# Patient Record
Sex: Female | Born: 1935 | Race: White | Hispanic: No | Smoking: Never smoker
Health system: Southern US, Community
[De-identification: ages and names within clinical notes are randomized; demographics above are authoritative.]

## PROBLEM LIST (undated history)

## (undated) DIAGNOSIS — H353290 Exudative age-related macular degeneration, unspecified eye, stage unspecified: Secondary | ICD-10-CM

## (undated) DIAGNOSIS — S62102A Fracture of unspecified carpal bone, left wrist, initial encounter for closed fracture: Secondary | ICD-10-CM

## (undated) DIAGNOSIS — G510 Bell's palsy: Secondary | ICD-10-CM

## (undated) DIAGNOSIS — E039 Hypothyroidism, unspecified: Secondary | ICD-10-CM

## (undated) DIAGNOSIS — H9190 Unspecified hearing loss, unspecified ear: Secondary | ICD-10-CM

## (undated) DIAGNOSIS — M1990 Unspecified osteoarthritis, unspecified site: Secondary | ICD-10-CM

## (undated) DIAGNOSIS — C8590 Non-Hodgkin lymphoma, unspecified, unspecified site: Secondary | ICD-10-CM

## (undated) DIAGNOSIS — F419 Anxiety disorder, unspecified: Secondary | ICD-10-CM

## (undated) DIAGNOSIS — J45909 Unspecified asthma, uncomplicated: Secondary | ICD-10-CM

## (undated) HISTORY — DX: Anxiety disorder, unspecified: F41.9

## (undated) HISTORY — DX: Exudative age-related macular degeneration, unspecified eye, stage unspecified: H35.3290

## (undated) HISTORY — DX: Non-Hodgkin lymphoma, unspecified, unspecified site: C85.90

## (undated) HISTORY — DX: Unspecified hearing loss, unspecified ear: H91.90

## (undated) HISTORY — DX: Hypothyroidism, unspecified: E03.9

## (undated) HISTORY — PX: CATARACT EXTRACTION: SUR2

## (undated) HISTORY — PX: TONSILLECTOMY: SUR1361

## (undated) HISTORY — PX: TUBAL LIGATION: SHX77

## (undated) HISTORY — DX: Unspecified osteoarthritis, unspecified site: M19.90

## (undated) HISTORY — DX: Bell's palsy: G51.0

## (undated) HISTORY — PX: APPENDECTOMY: SHX54

## (undated) HISTORY — DX: Unspecified asthma, uncomplicated: J45.909

## (undated) HISTORY — PX: CHOLECYSTECTOMY: SHX55

## (undated) HISTORY — DX: Fracture of unspecified carpal bone, left wrist, initial encounter for closed fracture: S62.102A

---

## 2017-09-18 DIAGNOSIS — J449 Chronic obstructive pulmonary disease, unspecified: Secondary | ICD-10-CM | POA: Insufficient documentation

## 2017-09-18 DIAGNOSIS — R000 Tachycardia, unspecified: Secondary | ICD-10-CM | POA: Insufficient documentation

## 2020-03-12 DIAGNOSIS — F329 Major depressive disorder, single episode, unspecified: Secondary | ICD-10-CM | POA: Insufficient documentation

## 2020-03-12 DIAGNOSIS — Z8572 Personal history of non-Hodgkin lymphomas: Secondary | ICD-10-CM | POA: Insufficient documentation

## 2020-03-12 DIAGNOSIS — M810 Age-related osteoporosis without current pathological fracture: Secondary | ICD-10-CM | POA: Insufficient documentation

## 2020-03-12 DIAGNOSIS — E669 Obesity, unspecified: Secondary | ICD-10-CM | POA: Insufficient documentation

## 2020-03-12 DIAGNOSIS — E1159 Type 2 diabetes mellitus with other circulatory complications: Secondary | ICD-10-CM | POA: Insufficient documentation

## 2020-03-12 DIAGNOSIS — J309 Allergic rhinitis, unspecified: Secondary | ICD-10-CM | POA: Insufficient documentation

## 2020-08-01 ENCOUNTER — Ambulatory Visit (INDEPENDENT_AMBULATORY_CARE_PROVIDER_SITE_OTHER): Payer: Medicare Other | Admitting: Nurse Practitioner

## 2020-08-01 ENCOUNTER — Encounter: Payer: Self-pay | Admitting: Nurse Practitioner

## 2020-08-01 ENCOUNTER — Other Ambulatory Visit: Payer: Self-pay

## 2020-08-01 VITALS — BP 122/78 | HR 85 | Temp 96.0°F | Ht 61.5 in | Wt 155.6 lb

## 2020-08-01 DIAGNOSIS — E039 Hypothyroidism, unspecified: Secondary | ICD-10-CM

## 2020-08-01 DIAGNOSIS — M170 Bilateral primary osteoarthritis of knee: Secondary | ICD-10-CM

## 2020-08-01 DIAGNOSIS — Z23 Encounter for immunization: Secondary | ICD-10-CM | POA: Diagnosis not present

## 2020-08-01 DIAGNOSIS — I10 Essential (primary) hypertension: Secondary | ICD-10-CM

## 2020-08-01 DIAGNOSIS — H353290 Exudative age-related macular degeneration, unspecified eye, stage unspecified: Secondary | ICD-10-CM

## 2020-08-01 DIAGNOSIS — E785 Hyperlipidemia, unspecified: Secondary | ICD-10-CM

## 2020-08-01 DIAGNOSIS — F419 Anxiety disorder, unspecified: Secondary | ICD-10-CM

## 2020-08-01 DIAGNOSIS — J4520 Mild intermittent asthma, uncomplicated: Secondary | ICD-10-CM

## 2020-08-01 NOTE — Progress Notes (Signed)
Careteam: No care team member to display  PLACE OF SERVICE:  [place]  Advanced Directive information    Allergies  Allergen Reactions   Iodine     Iodine Contrast    Chief Complaint  Patient presents with   Establish Care    New patient establish care. Flu vaccine today. Review medications and determine if any can be reduced. Patient does not use any assisted devices to ambulate. Patient lives with her daughter Butch Penny. Here with daughter, Butch Penny.      HPI: Patient is a 85 y.o. female to establish care.   Hx of non-hodgkins lymphoma- had a mild reaction to iodine in the past after one of her scan. In remission for so long she had not been to follow up or directed to do so.   Hx of bells palsy- decades ago  Osteoarthritis- severe in knee and hand (thumb) takes tylenol 650 mg twice daily, uses muscle rub as needed   Asthma- has albuterol uses daily and stiolto respimat daily   Hyperlipidemia- on zetia. Daughter does not feel like cholesterol has been an issue in the past. Needs fasting labs  Hypertension- controlled on verapamil 120 mg daily, had palpitations but no longer issue at this time.   Iron def anemia- on iron 3 times a week, gives her constipation so she does not take daily  Anxiety- on venlafaxine 75 mg twice daily. Daughter reports there was a lot of stress on her and things built up.  Doing well on current medication.   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 Recent diagnosis and getting injections. Got very worked up over the injection. Saw psychiatrist who prescribed hydroxyzine that she can take the day before and the day of which has helped. On preservision   Hypothyroid, acquired- on synthroid 75 mcg  Hx of bells palsy  Her daughter died in Mar 20, 2023 of breast cancer- still grieving the loss.   On MVI for supplement.    Review of Systems:  Review of Systems  Constitutional: Positive for malaise/fatigue.  Negative for chills, fever and weight loss.  HENT: Positive for hearing loss and tinnitus.   Respiratory: Positive for shortness of breath (due to asthma). Negative for cough and sputum production.   Cardiovascular: Negative for chest pain, palpitations and leg swelling.  Gastrointestinal: Positive for constipation and diarrhea. Negative for abdominal pain and heartburn.  Genitourinary: Negative for dysuria, frequency and urgency.  Musculoskeletal: Positive for joint pain and myalgias. Negative for back pain and falls.  Skin: Negative.   Neurological: Positive for dizziness. Negative for headaches.  Endo/Heme/Allergies: Bruises/bleeds easily (on ASA).  Psychiatric/Behavioral: Negative for depression and memory loss. The patient is nervous/anxious (controlled with medication). The patient does not have insomnia.     Past Medical History:  Diagnosis Date   Anxiety    Asthma    Bell's palsy    Hypothyroidism    Left wrist fracture    Macular degeneration, wet (HCC) nxiety   Non Hodgkin's lymphoma ([place])    Osteoarthritis    Past Surgical History:  Procedure Laterality Date   APPENDECTOMY     CATARACT EXTRACTION     CHOLECYSTECTOMY     TONSILLECTOMY     TUBAL LIGATION     Social History:   reports that she has never smoked. She has never used smokeless tobacco. She reports that she does not drink alcohol and does not use drugs.  Family History  Problem Relation Age  of Onset   Throat cancer Mother    Heart attack Father    Lung cancer Sister    Dementia Sister    COPD Sister    Obesity Daughter    Pulmonary embolism Daughter    Breast cancer Daughter    Endometriosis Daughter    Hashimoto's thyroiditis Daughter     Medications: Patient's Medications  New Prescriptions   No medications on file  Previous Medications   ACETAMINOPHEN (TYLENOL) 650 MG CR TABLET    Take 650 mg by mouth 2 (two) times daily.   ALBUTEROL (VENTOLIN HFA) 108 (90 BASE)  MCG/ACT INHALER    Inhale 2 puffs into the lungs. Emergency use   ASPIRIN EC 81 MG TABLET    Take 81 mg by mouth daily. Swallow whole.   EZETIMIBE (ZETIA) 10 MG TABLET    Take 10 mg by mouth daily.   FERROUS SULFATE 324 (65 FE) MG TBEC    Take by mouth 3 (three) times a week.   HYDROXYZINE (ATARAX/VISTARIL) 10 MG TABLET    Take 10 mg by mouth daily as needed.   LEVOTHYROXINE (SYNTHROID) 75 MCG TABLET    Take 75 mcg by mouth daily before breakfast.   MULTIPLE VITAMINS-MINERALS (CENTRUM SILVER PO)    Take 1 tablet by mouth daily.   MULTIPLE VITAMINS-MINERALS (PRESERVISION AREDS PO)    Take by mouth in the morning and at bedtime.   TIOTROPIUM BROMIDE-OLODATEROL (STIOLTO RESPIMAT) 2.5-2.5 MCG/ACT AERS    Inhale 2 puffs into the lungs daily.   VENLAFAXINE (EFFEXOR) 75 MG TABLET    Take 75 mg by mouth 2 (two) times daily.   VERAPAMIL (VERELAN PM) 120 MG 24 HR CAPSULE    Take 120 mg by mouth daily.  Modified Medications   No medications on file  Discontinued Medications   No medications on file    Physical Exam:  Vitals:   08/01/20 1339  BP: 122/78  Pulse: 85  Temp: (!) 96 F (35.6 C)  TempSrc: Temporal  SpO2: 96%  Weight: 155 lb 9.6 oz (70.6 kg)  Height: 5' 1.5" (1.562 m)   Body mass index is 28.92 kg/m. Wt Readings from Last 3 Encounters:  08/01/20 155 lb 9.6 oz (70.6 kg)    Physical Exam Constitutional:      General: She is not in acute distress.    Appearance: She is well-developed. She is not diaphoretic.  HENT:     Head: Normocephalic and atraumatic.     Mouth/Throat:     Pharynx: No oropharyngeal exudate.  Eyes:     Conjunctiva/sclera: Conjunctivae normal.     Pupils: Pupils are equal, round, and reactive to light.  Cardiovascular:     Rate and Rhythm: Normal rate and regular rhythm.     Heart sounds: Normal heart sounds.  Pulmonary:     Effort: Pulmonary effort is normal.     Breath sounds: Normal breath sounds.  Abdominal:     General: Bowel sounds are normal.      Palpations: Abdomen is soft.  Musculoskeletal:        General: No tenderness.     Cervical back: Normal range of motion and neck supple.  Skin:    General: Skin is warm and dry.  Neurological:     Mental Status: She is alert and oriented to person, place, and time.  Psychiatric:        Mood and Affect: Mood normal.        Behavior: Behavior normal.  Labs reviewed: Basic Metabolic Panel: No results for input(s): NA, K, CL, CO2, GLUCOSE, BUN, CREATININE, CALCIUM, MG, PHOS, TSH in the last 8760 hours. Liver Function Tests: No results for input(s): AST, ALT, ALKPHOS, BILITOT, PROT, ALBUMIN in the last 8760 hours. No results for input(s): LIPASE, AMYLASE in the last 8760 hours. No results for input(s): AMMONIA in the last 8760 hours. CBC: No results for input(s): WBC, NEUTROABS, HGB, HCT, MCV, PLT in the last 8760 hours. Lipid Panel: No results for input(s): CHOL, HDL, LDLCALC, TRIG, CHOLHDL, LDLDIRECT in the last 8760 hours. TSH: No results for input(s): TSH in the last 8760 hours. A1C: No results found for: HGBA1C   Assessment/Plan 1. Bilateral primary osteoarthritis of knee Ongoing, reports severe, taking tylenol 650 mg CR BID.  -encouraged exercise and strength training.  - AMB referral to orthopedics for further evaluation and possible injections -encouraged to avoid NSAIDs due to age and risk of GI and kidney complications.  2. Acquired hypothyroidism -stable on synthroid 75 mcg. - TSH; Future  3. Primary hypertension Controlled on verapamil with dietary modifications.  - COMPLETE METABOLIC PANEL WITH GFR; Future - CBC with Differential/Platelet; Future  4. Hyperlipidemia  -continues on zetia, questions if she needs this medication Will follow up fasting labs Continue dietary modifications.  - Lipid Panel; Future - COMPLETE METABOLIC PANEL WITH GFR; Future  5. Need for influenza vaccination - Flu Vaccine QUAD High Dose(Fluad)  6. Exudative age-related  macular degeneration, unspecified laterality, unspecified stage ([place]) Followed by retina specialist with injections, taking hydroxyzine PRN prior to injections due to increase in anxiety.  7. Mild intermittent asthma, unspecified whether complicated Controlled on stiolto daily with albuterol PRN  8. Anxiety Lots of life stressors and she started Effexor she has been doing well with this. To continue current regimen as it is well controlled at this time.   Next appt: 4 weeks for AWV 2 months for follow up [place]. [place], [facility] [phone]

## 2020-08-01 NOTE — Patient Instructions (Signed)
To schedule fasting labs in the next few weeks  Follow up for awv (telephone visit) in 1 month  In office follow up in 2 month for routine follow up

## 2020-08-08 ENCOUNTER — Other Ambulatory Visit: Payer: Self-pay

## 2020-08-08 ENCOUNTER — Other Ambulatory Visit: Payer: Medicare Other

## 2020-08-08 DIAGNOSIS — E785 Hyperlipidemia, unspecified: Secondary | ICD-10-CM

## 2020-08-08 DIAGNOSIS — I10 Essential (primary) hypertension: Secondary | ICD-10-CM

## 2020-08-08 DIAGNOSIS — E039 Hypothyroidism, unspecified: Secondary | ICD-10-CM

## 2020-08-11 LAB — COMPLETE METABOLIC PANEL WITH GFR
AG Ratio: 1.7 (calc) (ref 1.0–2.5)
ALT: 14 U/L (ref 6–29)
AST: 17 U/L (ref 10–35)
Albumin: 4 g/dL (ref 3.6–5.1)
Alkaline phosphatase (APISO): 71 U/L (ref 37–153)
BUN: 13 mg/dL (ref 7–25)
CO2: 30 mmol/L (ref 20–32)
Calcium: 9.3 mg/dL (ref 8.6–10.4)
Chloride: 104 mmol/L (ref 98–110)
Creat: 0.78 mg/dL (ref 0.60–0.88)
GFR, Est African American: 81 mL/min/{1.73_m2} (ref >=60)
GFR, Est Non African American: 70 mL/min/{1.73_m2} (ref >=60)
Globulin: 2.3 g/dL (calc) (ref 1.9–3.7)
Glucose, Bld: 107 mg/dL — ABNORMAL HIGH (ref 65–99)
Potassium: 4.1 mmol/L (ref 3.5–5.3)
Sodium: 141 mmol/L (ref 135–146)
Total Bilirubin: 0.5 mg/dL (ref 0.2–1.2)
Total Protein: 6.3 g/dL (ref 6.1–8.1)

## 2020-08-11 LAB — CBC WITH DIFFERENTIAL/PLATELET
Absolute Monocytes: 372 cells/uL (ref 200–950)
Basophils Absolute: 30 cells/uL (ref 0–200)
Basophils Relative: 0.5 %
Eosinophils Absolute: 130 cells/uL (ref 15–500)
Eosinophils Relative: 2.2 %
HCT: 42.7 % (ref 35.0–45.0)
Hemoglobin: 13.8 g/dL (ref 11.7–15.5)
Lymphs Abs: 2690 cells/uL (ref 850–3900)
MCH: 29.7 pg (ref 27.0–33.0)
MCHC: 32.3 g/dL (ref 32.0–36.0)
MCV: 92 fL (ref 80.0–100.0)
MPV: 9.2 fL (ref 7.5–12.5)
Monocytes Relative: 6.3 %
Neutro Abs: 2679 cells/uL (ref 1500–7800)
Neutrophils Relative %: 45.4 %
Platelets: 290 10*3/uL (ref 140–400)
RBC: 4.64 10*6/uL (ref 3.80–5.10)
RDW: 14.5 % (ref 11.0–15.0)
Total Lymphocyte: 45.6 %
WBC: 5.9 10*3/uL (ref 3.8–10.8)

## 2020-08-11 LAB — TEST AUTHORIZATION

## 2020-08-11 LAB — LIPID PANEL
Cholesterol: 182 mg/dL (ref <200)
HDL: 52 mg/dL (ref >=50)
LDL Cholesterol (Calc): 107 mg/dL (calc) — ABNORMAL HIGH
Non-HDL Cholesterol (Calc): 130 mg/dL (calc) — ABNORMAL HIGH (ref <130)
Total CHOL/HDL Ratio: 3.5 (calc) (ref <5.0)
Triglycerides: 115 mg/dL (ref <150)

## 2020-08-11 LAB — HEMOGLOBIN A1C
Hgb A1c MFr Bld: 5.6 % of total Hgb (ref <5.7)
Mean Plasma Glucose: 114 (calc)
eAG (mmol/L): 6.3 (calc)

## 2020-08-11 LAB — TSH: TSH: 5.81 mIU/L — ABNORMAL HIGH (ref 0.40–4.50)

## 2020-08-13 ENCOUNTER — Other Ambulatory Visit: Payer: Self-pay | Admitting: *Deleted

## 2020-08-13 MED ORDER — ALBUTEROL SULFATE HFA 108 (90 BASE) MCG/ACT IN AERS: 2.0000 | INHALATION_SPRAY | Freq: Every day | RESPIRATORY_TRACT | 3 refills | Status: DC | PRN

## 2020-08-13 NOTE — Telephone Encounter (Signed)
Patient daughter, Butch Penny, called requesting a refill. Stated that they left her inhaler in patient's hometown by accident when they moved and needs a refill.

## 2020-08-14 ENCOUNTER — Ambulatory Visit: Payer: Medicare Other | Admitting: Orthopedic Surgery

## 2020-08-21 ENCOUNTER — Ambulatory Visit (INDEPENDENT_AMBULATORY_CARE_PROVIDER_SITE_OTHER): Payer: Medicare Other

## 2020-08-21 ENCOUNTER — Ambulatory Visit (INDEPENDENT_AMBULATORY_CARE_PROVIDER_SITE_OTHER): Payer: Medicare Other | Admitting: Orthopaedic Surgery

## 2020-08-21 ENCOUNTER — Ambulatory Visit: Payer: Self-pay

## 2020-08-21 VITALS — Ht 61.5 in | Wt 155.6 lb

## 2020-08-21 DIAGNOSIS — M1711 Unilateral primary osteoarthritis, right knee: Secondary | ICD-10-CM | POA: Insufficient documentation

## 2020-08-21 DIAGNOSIS — G8929 Other chronic pain: Secondary | ICD-10-CM

## 2020-08-21 DIAGNOSIS — M25562 Pain in left knee: Secondary | ICD-10-CM

## 2020-08-21 DIAGNOSIS — M25561 Pain in right knee: Secondary | ICD-10-CM

## 2020-08-21 DIAGNOSIS — M1712 Unilateral primary osteoarthritis, left knee: Secondary | ICD-10-CM | POA: Insufficient documentation

## 2020-08-21 MED ORDER — MELOXICAM 15 MG PO TABS: 15.0000 mg | ORAL_TABLET | Freq: Every day | ORAL | 1 refills | Status: DC

## 2020-08-21 NOTE — Progress Notes (Signed)
Office Visit Note   Patient: Barbara Lopez           Date of Birth: March 25, 1935           MRN: [id number] Visit Date: 08/21/2020              Requested by: Lauree Chandler, NP [place],  [place] [id number] PCP: Lauree Chandler, NP   Assessment & Plan: Visit Diagnoses:  1. Chronic pain of left knee   2. Chronic pain of right knee   3. Unilateral primary osteoarthritis, left knee   4. Unilateral primary osteoarthritis, right knee     Plan: I agree with trying to stay as conservative as possible with her knees.  She has not interested in injections yet.  I agree with her taking Tylenol arthritis but we will add meloxicam daily.  I would like to see her back in 4 weeks to see how the meloxicam is working.  Celebrex would be another option as an anti-inflammatory.  We can always inject her knees if it comes to it.  All questions and concerns were answered and addressed.  Follow-Up Instructions: Return in about 4 weeks (around 09/18/2020).   Orders:  Orders Placed This Encounter  Procedures  . XR Knee 1-2 Views Left  . XR Knee 1-2 Views Right   Meds ordered this encounter  Medications  . meloxicam (MOBIC) 15 MG tablet    Sig: Take 1 tablet (15 mg total) by mouth daily.    Dispense:  30 tablet    Refill:  1      Procedures: No procedures performed   Clinical Data: No additional findings.   Subjective: Chief Complaint  Patient presents with  . Left Knee - Pain  . Right Knee - Pain  The patient comes in today as a new patient with her daughter.  She wants to establish care in our area now having moved from [place].  She has bilateral knee pains been going on for many years now.  Both knees hurt but the right is worse than left.  She is never had surgery on her knees.  She is never had injections.  She does not take any anti-inflammatories on a regular basis.  Her daughter who is with her who works in radiology states that there are some family members  that want her to consider knee replacement surgery.  She has not had therapy either but will probably have a hard time participating in therapy.  She has just some mild memory issues.  She is not a diabetic and is not on blood thinning medication.  She does take an occasional Tylenol for pain in her knees.  HPI  Review of Systems She currently denies any headache, chest pain, shortness of breath, fever, chills, nausea, vomiting  Objective: Vital Signs: Ht 5' 1.5" (1.562 m)   Wt 155 lb 9.6 oz (70.6 kg)   BMI 28.92 kg/m   Physical Exam She is alert and orient x3 and in no acute distress Ortho Exam Examination of both knees shows varus malalignment.  The right knee has no significant effusion but significant bony swelling and severe crepitation throughout range of motion of that knee.  The left knee also has some crepitation but not nearly as much of the right knee.  Both knees are ligamentously stable but painful throughout the arc of motion. Specialty Comments:  No specialty comments available.  Imaging: XR Knee 1-2 Views Left  Result Date: 08/21/2020  2 views of the left knee show tricompartmental arthritic changes with para-articular osteophytes in all 3 compartments and medial joint space narrowing with slight varus malalignment.  XR Knee 1-2 Views Right  Result Date: 08/21/2020 2 views of the right knee show severe end-stage arthritis with complete loss of the medial joint space.  There are large periarticular osteophytes in all 3 compartments and varus malalignment.    PMFS History: Patient Active Problem List   Diagnosis Date Noted  . Unilateral primary osteoarthritis, left knee 08/21/2020  . Unilateral primary osteoarthritis, right knee 08/21/2020   Past Medical History:  Diagnosis Date  . Anxiety   . Asthma   . Bell's palsy   . Hypothyroidism   . Left wrist fracture   . Macular degeneration, wet ([place]) nxiety  . Non Hodgkin's lymphoma ([place])   . Osteoarthritis      Family History  Problem Relation Age of Onset  . Throat cancer Mother   . Heart attack Father   . Lung cancer Sister   . Dementia Sister   . COPD Sister   . Obesity Daughter   . Pulmonary embolism Daughter   . Breast cancer Daughter   . Endometriosis Daughter   . Hashimoto's thyroiditis Daughter     Past Surgical History:  Procedure Laterality Date  . APPENDECTOMY    . CATARACT EXTRACTION    . CHOLECYSTECTOMY    . TONSILLECTOMY    . TUBAL LIGATION     Social History   Occupational History  . Not on file  Tobacco Use  . Smoking status: Never Smoker  . Smokeless tobacco: Never Used  Vaping Use  . Vaping Use: Never used  Substance and Sexual Activity  . Alcohol use: Never  . Drug use: Never  . Sexual activity: Not Currently

## 2020-09-12 ENCOUNTER — Ambulatory Visit: Payer: Medicare Other | Admitting: Nurse Practitioner

## 2020-09-14 ENCOUNTER — Other Ambulatory Visit: Payer: Self-pay

## 2020-09-14 ENCOUNTER — Telehealth: Payer: Self-pay

## 2020-09-14 ENCOUNTER — Ambulatory Visit (INDEPENDENT_AMBULATORY_CARE_PROVIDER_SITE_OTHER): Payer: Medicare Other | Admitting: Nurse Practitioner

## 2020-09-14 ENCOUNTER — Encounter: Payer: Self-pay | Admitting: Nurse Practitioner

## 2020-09-14 DIAGNOSIS — E2839 Other primary ovarian failure: Secondary | ICD-10-CM

## 2020-09-14 DIAGNOSIS — H9190 Unspecified hearing loss, unspecified ear: Secondary | ICD-10-CM

## 2020-09-14 DIAGNOSIS — Z0000 Encounter for general adult medical examination without abnormal findings: Secondary | ICD-10-CM

## 2020-09-14 NOTE — Patient Instructions (Signed)
Barbara Lopez , Thank you for taking time to come for your Medicare Wellness Visit. I appreciate your ongoing commitment to your health goals. Please review the following plan we discussed and let me know if I can assist you in the future.   Screening recommendations/referrals: Colonoscopy aged out Mammogram aged out Bone Density ordered today, call [phone] Recommended yearly ophthalmology/optometry visit for glaucoma screening and checkup Recommended yearly dental visit for hygiene and checkup  Vaccinations: Influenza vaccine up to date Pneumococcal vaccine up to date Tdap vaccine up to date  Shingles vaccine- RECOMMENDED to get [facility] at local pharmacy  Advanced directives: to completed and place on file. Look over MOST form (to complete together in office) healthcare POA and living will can be completed prior to office visit and notarized- bring to office to place on file.   Conditions/risks identified: advanced age, Memory loss   Next appointment: 1 year for AWV   Preventive Care 23 Years and Older, Female Preventive care refers to lifestyle choices and visits with your health care provider that can promote health and wellness. What does preventive care include?  A yearly physical exam. This is also called an annual well check.  Dental exams once or twice a year.  Routine eye exams. Ask your health care provider how often you should have your eyes checked.  Personal lifestyle choices, including:  Daily care of your teeth and gums.  Regular physical activity.  Eating a healthy diet.  Avoiding tobacco and drug use.  Limiting alcohol use.  Practicing safe sex.  Taking low-dose aspirin every day.  Taking vitamin and mineral supplements as recommended by your health care provider. What happens during an annual well check? The services and screenings done by your health care provider during your annual well check will depend on your age, overall health, lifestyle  risk factors, and family history of disease. Counseling  Your health care provider may ask you questions about your:  Alcohol use.  Tobacco use.  Drug use.  Emotional well-being.  Home and relationship well-being.  Sexual activity.  Eating habits.  History of falls.  Memory and ability to understand (cognition).  Work and work Statistician.  Reproductive health. Screening  You may have the following tests or measurements:  Height, weight, and BMI.  Blood pressure.  Lipid and cholesterol levels. These may be checked every 5 years, or more frequently if you are over 51 years old.  Skin check.  Lung cancer screening. You may have this screening every year starting at age 64 if you have a 30-pack-year history of smoking and currently smoke or have quit within the past 15 years.  Fecal occult blood test (FOBT) of the stool. You may have this test every year starting at age 54.  Flexible sigmoidoscopy or colonoscopy. You may have a sigmoidoscopy every 5 years or a colonoscopy every 10 years starting at age 60.  Hepatitis C blood test.  Hepatitis B blood test.  Sexually transmitted disease (STD) testing.  Diabetes screening. This is done by checking your blood sugar (glucose) after you have not eaten for a while (fasting). You may have this done every 1-3 years.  Bone density scan. This is done to screen for osteoporosis. You may have this done starting at age 76.  Mammogram. This may be done every 1-2 years. Talk to your health care provider about how often you should have regular mammograms. Talk with your health care provider about your test results, treatment options, and if necessary, the  need for more tests. Vaccines  Your health care provider may recommend certain vaccines, such as:  Influenza vaccine. This is recommended every year.  Tetanus, diphtheria, and acellular pertussis (Tdap, Td) vaccine. You may need a Td booster every 10 years.  Zoster vaccine.  You may need this after age 9.  Pneumococcal 13-valent conjugate (PCV13) vaccine. One dose is recommended after age 21.  Pneumococcal polysaccharide (PPSV23) vaccine. One dose is recommended after age 67. Talk to your health care provider about which screenings and vaccines you need and how often you need them. This information is not intended to replace advice given to you by your health care provider. Make sure you discuss any questions you have with your health care provider. Document Released: 09/21/2015 Document Revised: 05/14/2016 Document Reviewed: 06/26/2015 Elsevier Interactive Patient Education  2017 [place] Prevention in the Home Falls can cause injuries. They can happen to people of all ages. There are many things you can do to make your home safe and to help prevent falls. What can I do on the outside of my home?  Regularly fix the edges of walkways and driveways and fix any cracks.  Remove anything that might make you trip as you walk through a door, such as a raised step or threshold.  Trim any bushes or trees on the path to your home.  Use bright outdoor lighting.  Clear any walking paths of anything that might make someone trip, such as rocks or tools.  Regularly check to see if handrails are loose or broken. Make sure that both sides of any steps have handrails.  Any raised decks and porches should have guardrails on the edges.  Have any leaves, snow, or ice cleared regularly.  Use sand or salt on walking paths during winter.  Clean up any spills in your garage right away. This includes oil or grease spills. What can I do in the bathroom?  Use night lights.  Install grab bars by the toilet and in the tub and shower. Do not use towel bars as grab bars.  Use non-skid mats or decals in the tub or shower.  If you need to sit down in the shower, use a plastic, non-slip stool.  Keep the floor dry. Clean up any water that spills on the floor as soon  as it happens.  Remove soap buildup in the tub or shower regularly.  Attach bath mats securely with double-sided non-slip rug tape.  Do not have throw rugs and other things on the floor that can make you trip. What can I do in the bedroom?  Use night lights.  Make sure that you have a light by your bed that is easy to reach.  Do not use any sheets or blankets that are too big for your bed. They should not hang down onto the floor.  Have a firm chair that has side arms. You can use this for support while you get dressed.  Do not have throw rugs and other things on the floor that can make you trip. What can I do in the kitchen?  Clean up any spills right away.  Avoid walking on wet floors.  Keep items that you use a lot in easy-to-reach places.  If you need to reach something above you, use a strong step stool that has a grab bar.  Keep electrical cords out of the way.  Do not use floor polish or wax that makes floors slippery. If you must use wax,  use non-skid floor wax.  Do not have throw rugs and other things on the floor that can make you trip. What can I do with my stairs?  Do not leave any items on the stairs.  Make sure that there are handrails on both sides of the stairs and use them. Fix handrails that are broken or loose. Make sure that handrails are as long as the stairways.  Check any carpeting to make sure that it is firmly attached to the stairs. Fix any carpet that is loose or worn.  Avoid having throw rugs at the top or bottom of the stairs. If you do have throw rugs, attach them to the floor with carpet tape.  Make sure that you have a light switch at the top of the stairs and the bottom of the stairs. If you do not have them, ask someone to add them for you. What else can I do to help prevent falls?  Wear shoes that:  Do not have high heels.  Have rubber bottoms.  Are comfortable and fit you well.  Are closed at the toe. Do not wear sandals.  If  you use a stepladder:  Make sure that it is fully opened. Do not climb a closed stepladder.  Make sure that both sides of the stepladder are locked into place.  Ask someone to hold it for you, if possible.  Clearly mark and make sure that you can see:  Any grab bars or handrails.  First and last steps.  Where the edge of each step is.  Use tools that help you move around (mobility aids) if they are needed. These include:  Canes.  Walkers.  Scooters.  Crutches.  Turn on the lights when you go into a dark area. Replace any light bulbs as soon as they burn out.  Set up your furniture so you have a clear path. Avoid moving your furniture around.  If any of your floors are uneven, fix them.  If there are any pets around you, be aware of where they are.  Review your medicines with your doctor. Some medicines can make you feel dizzy. This can increase your chance of falling. Ask your doctor what other things that you can do to help prevent falls. This information is not intended to replace advice given to you by your health care provider. Make sure you discuss any questions you have with your health care provider. Document Released: 06/21/2009 Document Revised: 01/31/2016 Document Reviewed: 09/29/2014 Elsevier Interactive Patient Education  2017 [employer].

## 2020-09-14 NOTE — Telephone Encounter (Signed)
Ms. iyanla, eilers are scheduled for a virtual visit with your provider today.    Just as we do with appointments in the office, we must obtain your consent to participate.  Your consent will be active for this visit and any virtual visit you may have with one of our providers in the next 365 days.    If you have a MyChart account, I can also send a copy of this consent to you electronically.  All virtual visits are billed to your insurance company just like a traditional visit in the office.  As this is a virtual visit, video technology does not allow for your provider to perform a traditional examination.  This may limit your provider's ability to fully assess your condition.  If your provider identifies any concerns that need to be evaluated in person or the need to arrange testing such as labs, EKG, etc, we will make arrangements to do so.    Although advances in technology are sophisticated, we cannot ensure that it will always work on either your end or our end.  If the connection with a video visit is poor, we may have to switch to a telephone visit.  With either a video or telephone visit, we are not always able to ensure that we have a secure connection.   I need to obtain your verbal consent now.   Are you willing to proceed with your visit today?   Shandie Farina and daugher, Butch Penny has provided verbal consent on 09/14/2020 for a virtual visit (video or telephone).   Leigh [place], [place] 09/14/2020  8:43 AM

## 2020-09-14 NOTE — Progress Notes (Signed)
   This service is provided via telemedicine  No vital signs collected/recorded due to the encounter was a telemedicine visit.   Location of patient (ex: home, work):  Home  Patient consents to a telephone visit: Yes, see telephone visit dated 09/14/20  Location of the provider (ex: office, home):  [facility], Office   Name of any referring provider:  N/A  Names of all persons participating in the telemedicine service and their role in the encounter:  S.Chrae B/CMA, Sherrie Mustache, NP, Butch Penny (daughter), and Patient   Time spent on call:  9 min with medical assistant

## 2020-09-14 NOTE — Progress Notes (Signed)
Subjective:   Barbara Lopez is a 86 y.o. female who presents for Medicare Annual (Subsequent) preventive examination.  Review of Systems     Cardiac Risk Factors include: advanced age (>51men, >51 women)     Objective:    There were no vitals filed for this visit. There is no height or weight on file to calculate BMI.  No flowsheet data found.  Current Medications (verified) Outpatient Encounter Medications as of 09/14/2020  Medication Sig  . acetaminophen (TYLENOL) 650 MG CR tablet Take 650 mg by mouth as needed.  Marland Kitchen albuterol (VENTOLIN HFA) 108 (90 Base) MCG/ACT inhaler Inhale 2 puffs into the lungs daily as needed for wheezing or shortness of breath. Emergency use  . ezetimibe (ZETIA) 10 MG tablet Take 10 mg by mouth daily.  . ferrous sulfate 324 (65 Fe) MG TBEC Take by mouth 3 (three) times a week.  . hydrOXYzine (ATARAX/VISTARIL) 10 MG tablet Take 10 mg by mouth daily as needed.  Marland Kitchen levothyroxine (SYNTHROID) 75 MCG tablet Take 75 mcg by mouth daily before breakfast.  . meloxicam (MOBIC) 15 MG tablet Take 1 tablet (15 mg total) by mouth daily.  . Multiple Vitamins-Minerals (CENTRUM SILVER PO) Take 1 tablet by mouth daily.  . Multiple Vitamins-Minerals (PRESERVISION AREDS PO) Take by mouth in the morning and at bedtime.  . Tiotropium Bromide-Olodaterol (STIOLTO RESPIMAT) 2.5-2.5 MCG/ACT AERS Inhale 2 puffs into the lungs daily.  Marland Kitchen venlafaxine (EFFEXOR) 75 MG tablet Take 75 mg by mouth 2 (two) times daily.  . verapamil (VERELAN PM) 120 MG 24 hr capsule Take 120 mg by mouth daily.  . [DISCONTINUED] aspirin EC 81 MG tablet Take 81 mg by mouth daily. Swallow whole.   No facility-administered encounter medications on file as of 09/14/2020.    Allergies (verified) Iodine   History: Past Medical History:  Diagnosis Date  . Anxiety   . Asthma   . Bell's palsy   . Hypothyroidism   . Left wrist fracture   . Macular degeneration, wet ([place]) nxiety  . Non Hodgkin's lymphoma ([place])    . Osteoarthritis    Past Surgical History:  Procedure Laterality Date  . APPENDECTOMY    . CATARACT EXTRACTION    . CHOLECYSTECTOMY    . TONSILLECTOMY    . TUBAL LIGATION     Family History  Problem Relation Age of Onset  . Throat cancer Mother   . Heart attack Father   . Lung cancer Sister   . Dementia Sister   . COPD Sister   . Obesity Daughter   . Pulmonary embolism Daughter   . Breast cancer Daughter   . Endometriosis Daughter   . Hashimoto's thyroiditis Daughter    Social History   Socioeconomic History  . Marital status: Widowed    Spouse name: Not on file  . Number of children: Not on file  . Years of education: Not on file  . Highest education level: Not on file  Occupational History  . Not on file  Tobacco Use  . Smoking status: Never Smoker  . Smokeless tobacco: Never Used  Vaping Use  . Vaping Use: Never used  Substance and Sexual Activity  . Alcohol use: Never  . Drug use: Never  . Sexual activity: Not Currently  Other Topics Concern  . Not on file  Social History Narrative   Diet: Regular      Do you drink/ eat things with caffeine? Coffee      Marital status:   Widowed  What year were you married ? 1957      Do you live in a house, apartment,assistred living, condo, trailer, etc.)?  House      Is it one or more stories? 2 Stories      How many persons live in your home ? 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  House Work and Walk to [employer]                            Type & how often  Daily      ADVANCED DIRECTIVES (Please bring copies)      Do you have a living will? No      Do you have a DNR form? No                      If not, do you want to discuss one? Yes      Do you have signed POA?HPOA forms?   No              If so, please bring to your appointment       FUNCTIONAL STATUS- To be completed by Spouse / child / Staff       Do you have difficulty bathing or dressing yourself ?  No      Do you have difficulty preparing food or eating ?  No      Do you have difficulty managing your mediation ?  No      Do you have difficulty managing your finances ?  No      Do you have difficulty affording your medication ?  No      Social Determinants of Radio broadcast assistant Strain: Not on file  Food Insecurity: Not on file  Transportation Needs: Not on file  Physical Activity: Not on file  Stress: Not on file  Social Connections: Not on file    Tobacco Counseling Counseling given: Not Answered   Clinical Intake:  Pre-visit preparation completed: Yes  Pain : No/denies pain     BMI - recorded: 28 Nutritional Status: BMI 25 -29 Overweight Diabetes: No  How often do you need to have someone help you when you read instructions, pamphlets, or other written materials from your doctor or pharmacy?: 4 - Often  Diabetic?no         Activities of Daily Living In your present state of health, do you have any difficulty performing the following activities: 09/14/2020  Hearing? Y  Vision? Y  Difficulty concentrating or making decisions? N  Walking or climbing stairs? N  Dressing or bathing? N  Doing errands, shopping? N  Preparing Food and eating ? N  Using the Toilet? N  In the past six months, have you accidently leaked urine? N  Do you have problems with loss of bowel control? N  Managing your Medications? N  Managing your Finances? Y  Comment daughter help  Housekeeping or managing your Housekeeping? N    Patient Care Team: Lauree Chandler, NP as PCP - General (Geriatric Medicine)  Indicate any recent Medical Services you may have received from other than [facility] providers in the past year (date  may be approximate).     Assessment:   This is a routine wellness examination for Barbara Lopez.  Hearing/Vision screen   Hearing Screening   125Hz  250Hz  500Hz  1000Hz  2000Hz  3000Hz  4000Hz  6000Hz  8000Hz   Right ear:           Left ear:           Comments: Decreased hearing, needs to be evaluated   Vision Screening Comments: Last eye exam less than 12 months ago  Dietary issues and exercise activities discussed: Current Exercise Habits: The patient does not participate in regular exercise at present  Goals    . Patient Stated     To maintain current level of health      Depression Screen PHQ 2/9 Scores 09/14/2020 08/01/2020  PHQ - 2 Score 0 0    Fall Risk Fall Risk  09/14/2020 08/01/2020  Falls in the past year? 0 1  Number falls in past yr: 0 0  Injury with Fall? 0 0    FALL RISK PREVENTION PERTAINING TO THE HOME:  Any stairs in or around the home? Yes  If so, are there any without handrails? No  Home free of loose throw rugs in walkways, pet beds, electrical cords, etc? Yes  Adequate lighting in your home to reduce risk of falls? Yes   ASSISTIVE DEVICES UTILIZED TO PREVENT FALLS:  Life alert? No  Use of a cane, walker or w/c? No  Grab bars in the bathroom? Yes  Shower chair or bench in shower? Yes  Elevated toilet seat or a handicapped toilet? Yes   TIMED UP AND GO:  Was the test performed? No .    Cognitive Function:     6CIT Screen 09/14/2020  What Year? 0 points  What month? 0 points  What time? 0 points  Count back from 20 0 points  Months in reverse 4 points  Repeat phrase 6 points  Total Score 10    Immunizations Immunization History  Administered Date(s) Administered  . Fluad Quad(high Dose 65+) 08/01/2020  . Influenza-Unspecified 06/17/2019  . Moderna SARS-COV2 Booster Vaccination 09/11/2020  . Moderna Sars-Covid-2 Vaccination 11/04/2019, 12/02/2019  . Pneumococcal Conjugate-13 07/18/2016  . Pneumococcal Polysaccharide-23 04/08/2002  . Tdap 12/26/2016  . Zoster 07/18/2016    TDAP status: Due, Education has been provided regarding the importance of this vaccine.  Advised may receive this vaccine at local pharmacy or Health Dept. Aware to provide a copy of the vaccination record if obtained from local pharmacy or Health Dept. Verbalized acceptance and understanding.  Flu Vaccine status: Up to date  Pneumococcal vaccine status: Up to date  Covid-19 vaccine status: Completed vaccines  Qualifies for Shingles Vaccine? Yes   Zostavax completed Yes   Shingrix Completed?: No.    Education has been provided regarding the importance of this vaccine. Patient has been advised to call insurance company to determine out of pocket expense if they have not yet received this vaccine. Advised may also receive vaccine at local pharmacy or Health Dept. Verbalized acceptance and understanding.  Screening Tests Health Maintenance  Topic Date Due  . URINE MICROALBUMIN  Never done  . DEXA SCAN  Never done  . TETANUS/TDAP  12/27/2026  . INFLUENZA VACCINE  Completed  . COVID-19 Vaccine  Completed  . PNA vac Low Risk Adult  Completed    Health Maintenance  Health Maintenance Due  Topic Date Due  . URINE MICROALBUMIN  Never done  . DEXA SCAN  Never done    Colorectal  cancer screening: No longer required.   Mammogram status: No longer required due to age.  Bone Density ordered  Lung Cancer Screening: (Low Dose CT Chest recommended if Age 67-80 years, 30 pack-year currently smoking OR have quit w/in 15years.) does not qualify.   Lung Cancer Screening Referral: na  Additional Screening:  Hepatitis C Screening: does not qualify;   Vision Screening: Recommended annual ophthalmology exams for early detection of glaucoma and other disorders of the eye. Is the patient up to date with their annual eye exam?  Yes  Who is the provider or what is the name of the office in which the patient attends annual eye exams? [facility] ophthalmology If pt is not established with a provider, would they like to be referred to a provider to establish care? No .   Dental  Screening: Recommended annual dental exams for proper oral hygiene  Community Resource Referral / Chronic Care Management: CRR required this visit?  No   CCM required this visit?  No      Plan:     I have personally reviewed and noted the following in the patient's chart:   . Medical and social history . Use of alcohol, tobacco or illicit drugs  . Current medications and supplements . Functional ability and status . Nutritional status . Physical activity . Advanced directives . List of other physicians . Hospitalizations, surgeries, and ER visits in previous 12 months . Vitals . Screenings to include cognitive, depression, and falls . Referrals and appointments  In addition, I have reviewed and discussed with patient certain preventive protocols, quality metrics, and best practice recommendations. A written personalized care plan for preventive services as well as general preventive health recommendations were provided to patient.     Lauree Chandler, NP   09/14/2020    Virtual Visit via Telephone Note  I connected with@ on 09/14/20 at  8:30 AM EST by telephone and verified that I am speaking with the correct person using two identifiers.  Location: Patient: home Provider: [place]   I discussed the limitations, risks, security and privacy concerns of performing an evaluation and management service by telephone and the availability of in person appointments. I also discussed with the patient that there may be a patient responsible charge related to this service. The patient expressed understanding and agreed to proceed.   I discussed the assessment and treatment plan with the patient. The patient was provided an opportunity to ask questions and all were answered. The patient agreed with the plan and demonstrated an understanding of the instructions.   The patient was advised to call back or seek an in-person evaluation if the symptoms worsen or if the condition fails to  improve as anticipated.  I provided 18 minutes of non-face-to-face time during this encounter.  Carlos American. Harle Battiest Avs printed and mailed

## 2020-09-18 ENCOUNTER — Ambulatory Visit (INDEPENDENT_AMBULATORY_CARE_PROVIDER_SITE_OTHER): Payer: Medicare Other | Admitting: Orthopaedic Surgery

## 2020-09-18 ENCOUNTER — Encounter: Payer: Self-pay | Admitting: Orthopaedic Surgery

## 2020-09-18 DIAGNOSIS — M25562 Pain in left knee: Secondary | ICD-10-CM

## 2020-09-18 DIAGNOSIS — M25561 Pain in right knee: Secondary | ICD-10-CM | POA: Diagnosis not present

## 2020-09-18 DIAGNOSIS — M1712 Unilateral primary osteoarthritis, left knee: Secondary | ICD-10-CM | POA: Diagnosis not present

## 2020-09-18 DIAGNOSIS — G8929 Other chronic pain: Secondary | ICD-10-CM

## 2020-09-18 DIAGNOSIS — M1711 Unilateral primary osteoarthritis, right knee: Secondary | ICD-10-CM

## 2020-09-18 NOTE — Progress Notes (Signed)
The patient comes in today for follow-up as a relates to her chronic pain of both her knees and known arthritis with both her knees with the right worse than left.  She has never had steroid injections in her knees.  She is an active 86 year old female.  She states that starting her on meloxicam is helped quite a bit and she is not really having a lot of pain in her knees.  Examination of her knees showed grinding much worse on the right than the left and a small slight flexion contracture on the right.  However she is not having significant symptoms in terms of pain.  We are going to stay conservative as possible since she is doing so well.  My next step would be considering steroid injections in her knees if the pain gets bad enough for her.  Her daughter is with her.  She understands this treatment plan.  All questions and concerns were answered and addressed.

## 2020-10-03 ENCOUNTER — Ambulatory Visit: Payer: Medicare Other | Admitting: Nurse Practitioner

## 2020-10-05 ENCOUNTER — Other Ambulatory Visit: Payer: Self-pay

## 2020-10-05 ENCOUNTER — Ambulatory Visit (INDEPENDENT_AMBULATORY_CARE_PROVIDER_SITE_OTHER): Payer: Medicare Other | Admitting: Nurse Practitioner

## 2020-10-05 ENCOUNTER — Other Ambulatory Visit: Payer: Self-pay | Admitting: Orthopaedic Surgery

## 2020-10-05 ENCOUNTER — Encounter: Payer: Self-pay | Admitting: Nurse Practitioner

## 2020-10-05 VITALS — BP 140/80 | HR 84 | Temp 96.9°F | Ht 61.5 in | Wt 156.6 lb

## 2020-10-05 DIAGNOSIS — M170 Bilateral primary osteoarthritis of knee: Secondary | ICD-10-CM | POA: Insufficient documentation

## 2020-10-05 DIAGNOSIS — F419 Anxiety disorder, unspecified: Secondary | ICD-10-CM

## 2020-10-05 DIAGNOSIS — E039 Hypothyroidism, unspecified: Secondary | ICD-10-CM

## 2020-10-05 DIAGNOSIS — I10 Essential (primary) hypertension: Secondary | ICD-10-CM

## 2020-10-05 DIAGNOSIS — H9190 Unspecified hearing loss, unspecified ear: Secondary | ICD-10-CM | POA: Diagnosis not present

## 2020-10-05 DIAGNOSIS — H353290 Exudative age-related macular degeneration, unspecified eye, stage unspecified: Secondary | ICD-10-CM | POA: Diagnosis not present

## 2020-10-05 DIAGNOSIS — E785 Hyperlipidemia, unspecified: Secondary | ICD-10-CM

## 2020-10-05 DIAGNOSIS — R0683 Snoring: Secondary | ICD-10-CM

## 2020-10-05 DIAGNOSIS — J4520 Mild intermittent asthma, uncomplicated: Secondary | ICD-10-CM

## 2020-10-05 DIAGNOSIS — R413 Other amnesia: Secondary | ICD-10-CM

## 2020-10-05 NOTE — Patient Instructions (Signed)
Vit d 2000 units daily  Calcium 600 mg twice daily

## 2020-10-05 NOTE — Progress Notes (Signed)
Careteam: Patient Care Team: Lauree Chandler, NP as PCP - General (Geriatric Medicine)  PLACE OF SERVICE:  [place] Directive information Does Patient Have a Medical Advance Directive?: No, Would patient like information on creating a medical advance directive?: Yes (MAU/Ambulatory/Procedural Areas - Information given)  Allergies  Allergen Reactions  . Ace Inhibitors Other (See Comments)  . Iodinated Diagnostic Agents   . Iodine     Iodine Contrast  . Other Rash    Chief Complaint  Patient presents with  . Medical Management of Chronic Issues    2 month follow up. Patient's daughter would like to discuss sleep apnea because of asthma. Would also like to discuss vitamins. Discuss need for dexa scan and urine microalbumin     HPI: Patient is a 86 y.o. female for routine follow up.  Reports she is doing good.   Reports hearing loss in both ears. More in the right than left. Planning to get hearing aides to help her stay involved in conversations and engaged.   Memory loss- daughter feels like she does well at this time. Pt lives with her daughter. Helps with medication.   Anxiety- stable, continues on effexor twice daily   Asthma-  Feels like asthma is under good control. Uses albuterol a few times a week.  Daughter is concerned of sleep apnea- she reports she snores and then will hear her "sputtering" when she sleeps. Daughter reports it sounds like she is gasping for air    Review of Systems:  Review of Systems  Constitutional: Negative for chills, fever and weight loss.  HENT: Positive for hearing loss. Negative for tinnitus.   Respiratory: Negative for cough, sputum production and shortness of breath.   Cardiovascular: Negative for chest pain, palpitations and leg swelling.  Gastrointestinal: Positive for constipation and diarrhea. Negative for abdominal pain and heartburn.  Genitourinary: Negative for dysuria, frequency and urgency.   Musculoskeletal: Negative for back pain, falls, joint pain (knees) and myalgias.  Skin: Negative.   Neurological: Negative for dizziness and headaches.  Psychiatric/Behavioral: Negative for depression and memory loss. The patient is nervous/anxious (controlled). The patient does not have insomnia.     Past Medical History:  Diagnosis Date  . Anxiety   . Asthma   . Bell's palsy   . Hearing loss bilateral  . Hypothyroidism   . Left wrist fracture   . Macular degeneration, wet ([place]) nxiety  . Non Hodgkin's lymphoma ([place])   . Osteoarthritis    Past Surgical History:  Procedure Laterality Date  . APPENDECTOMY    . CATARACT EXTRACTION    . CHOLECYSTECTOMY    . TONSILLECTOMY    . TUBAL LIGATION     Social History:   reports that she has never smoked. She has never used smokeless tobacco. She reports that she does not drink alcohol and does not use drugs.  Family History  Problem Relation Age of Onset  . Throat cancer Mother   . Heart attack Father   . Lung cancer Sister   . Dementia Sister   . COPD Sister   . Obesity Daughter   . Pulmonary embolism Daughter   . Breast cancer Daughter   . Endometriosis Daughter   . Hashimoto's thyroiditis Daughter     Medications: Patient's Medications  New Prescriptions   No medications on file  Previous Medications   ALBUTEROL (VENTOLIN HFA) 108 (90 BASE) MCG/ACT INHALER    Inhale 2 puffs into the lungs daily as needed  for wheezing or shortness of breath. Emergency use   EZETIMIBE (ZETIA) 10 MG TABLET    Take 10 mg by mouth daily.   FERROUS SULFATE 324 (65 FE) MG TBEC    Take by mouth 3 (three) times a week.   HYDROXYZINE (ATARAX/VISTARIL) 10 MG TABLET    Take 10 mg by mouth daily as needed.   LEVOTHYROXINE (SYNTHROID) 75 MCG TABLET    Take 75 mcg by mouth daily before breakfast.   MELOXICAM (MOBIC) 15 MG TABLET    TAKE ONE TABLET BY MOUTH DAILY   MULTIPLE VITAMINS-MINERALS (CENTRUM SILVER PO)    Take 1 tablet by mouth daily.    MULTIPLE VITAMINS-MINERALS (PRESERVISION AREDS PO)    Take by mouth in the morning and at bedtime.   TIOTROPIUM BROMIDE-OLODATEROL (STIOLTO RESPIMAT) 2.5-2.5 MCG/ACT AERS    Inhale 2 puffs into the lungs daily.   VENLAFAXINE (EFFEXOR) 75 MG TABLET    Take 75 mg by mouth 2 (two) times daily.   VERAPAMIL (VERELAN PM) 120 MG 24 HR CAPSULE    Take 120 mg by mouth daily.  Modified Medications   No medications on file  Discontinued Medications   ACETAMINOPHEN (TYLENOL) 650 MG CR TABLET    Take 650 mg by mouth as needed.    Physical Exam:  Vitals:   10/05/20 1536  BP: 140/80  Pulse: 84  Temp: (!) 96.9 F (36.1 C)  TempSrc: Temporal  SpO2: 97%  Weight: 156 lb 9.6 oz (71 kg)  Height: 5' 1.5" (1.562 m)   Body mass index is 29.11 kg/m. Wt Readings from Last 3 Encounters:  10/05/20 156 lb 9.6 oz (71 kg)  08/21/20 155 lb 9.6 oz (70.6 kg)  08/01/20 155 lb 9.6 oz (70.6 kg)    Physical Exam Constitutional:      General: She is not in acute distress.    Appearance: She is well-developed and well-nourished. She is not diaphoretic.  HENT:     Head: Normocephalic and atraumatic.     Right Ear: Tympanic membrane, ear canal and external ear normal.     Left Ear: Tympanic membrane, ear canal and external ear normal.     Mouth/Throat:     Mouth: Oropharynx is clear and moist.     Pharynx: No oropharyngeal exudate.  Eyes:     Conjunctiva/sclera: Conjunctivae normal.     Pupils: Pupils are equal, round, and reactive to light.  Cardiovascular:     Rate and Rhythm: Normal rate and regular rhythm.     Heart sounds: Normal heart sounds.  Pulmonary:     Effort: Pulmonary effort is normal.     Breath sounds: Normal breath sounds.  Abdominal:     General: Bowel sounds are normal.     Palpations: Abdomen is soft.  Musculoskeletal:        General: No edema.     Cervical back: Normal range of motion and neck supple.     Right knee: Crepitus present. Tenderness present.     Left knee: Crepitus  present. Tenderness present.  Skin:    General: Skin is warm and dry.  Neurological:     Mental Status: She is alert and oriented to person, place, and time.  Psychiatric:        Mood and Affect: Mood and affect and mood normal.        Behavior: Behavior normal.     Labs reviewed: Basic Metabolic Panel: Recent Labs    08/08/20 0833  NA 141  K 4.1  CL 104  CO2 30  GLUCOSE 107*  BUN 13  CREATININE 0.78  CALCIUM 9.3  TSH 5.81*   Liver Function Tests: Recent Labs    08/08/20 0833  AST 17  ALT 14  BILITOT 0.5  PROT 6.3   No results for input(s): LIPASE, AMYLASE in the last 8760 hours. No results for input(s): AMMONIA in the last 8760 hours. CBC: Recent Labs    08/08/20 0833  WBC 5.9  NEUTROABS 2,679  HGB 13.8  HCT 42.7  MCV 92.0  PLT 290   Lipid Panel: Recent Labs    08/08/20 0833  CHOL 182  HDL 52  LDLCALC 107*  TRIG 115  CHOLHDL 3.5   TSH: Recent Labs    08/08/20 0833  TSH 5.81*   A1C: Lab Results  Component Value Date   HGBA1C 5.6 08/08/2020     Assessment/Plan 1. Exudative age-related macular degeneration, unspecified laterality, unspecified stage ([place]) Continues to be followed by specialist. Using hydroxyzine PRN anxiety for appts  2. Snoring -concerns of OSA due to snoring and sounds of gasping for air by daughter  - Ambulatory referral to Pulmonology  3. Hearing loss, unspecified hearing loss type, unspecified laterality -plans to get hearing aides.   4. Bilateral primary osteoarthritis of knee Using meloxicam- encouraged to use 1/2 tablet daily.  Advised on side effects of NSAID use.   5. Primary hypertension Stable. Reports home bp at goal <140/90 .  6. Hyperlipidemia LDL goal <100 Continues on zetia with dietary modifications.   7. Acquired hypothyroidism TSH mildly elevated, continues on synthroid 75 mcg daily  8. Mild intermittent asthma, unspecified whether complicated Stable, continues on stiolto inhaler,  occasionally will use albuterol PRN.   9. Anxiety Controlled on effexor twice daily  10. Memory loss Abnormal screening noted on 6CIT, daughter and pt do not feel like this is an issue at this time. Pt continues to be very active and stimulated at home.  Next appt: 6 months, labs prior  Charvez Voorhies K. [place], [facility] [phone]

## 2020-10-07 ENCOUNTER — Other Ambulatory Visit: Payer: Self-pay | Admitting: Orthopaedic Surgery

## 2020-10-11 ENCOUNTER — Other Ambulatory Visit: Payer: Self-pay | Admitting: *Deleted

## 2020-10-11 MED ORDER — LEVOTHYROXINE SODIUM 75 MCG PO TABS: 75.0000 ug | ORAL_TABLET | Freq: Every day | ORAL | 1 refills | Status: DC

## 2020-10-11 MED ORDER — VENLAFAXINE HCL 75 MG PO TABS: 75.0000 mg | ORAL_TABLET | Freq: Two times a day (BID) | ORAL | 1 refills | Status: DC

## 2020-10-11 NOTE — Telephone Encounter (Signed)
Barbara Lopez, daughter, requested refills.

## 2020-10-15 ENCOUNTER — Telehealth: Payer: Self-pay

## 2020-10-15 MED ORDER — EZETIMIBE 10 MG PO TABS: 10.0000 mg | ORAL_TABLET | Freq: Every day | ORAL | 2 refills | Status: DC

## 2020-10-15 NOTE — Telephone Encounter (Signed)
Patient needed a refill on Zetia 10 mg

## 2020-12-03 ENCOUNTER — Telehealth: Payer: Self-pay

## 2020-12-03 NOTE — Telephone Encounter (Signed)
Would recommend an appt to discuss in office to make sure nothing else is going on.

## 2020-12-03 NOTE — Telephone Encounter (Signed)
Patient's daughter, Butch Penny called wanting to know if mother's blood pressure medication should be adjusted.She has been experiencing a lot more fatigue. Her blood pressure readings from the weekend were  Saturday  109/89 119/83 100/67 6pm  Today 120/72 116/90  Please advise. Routed to Sherrie Mustache, NP

## 2020-12-10 ENCOUNTER — Other Ambulatory Visit: Payer: Self-pay

## 2020-12-10 ENCOUNTER — Encounter: Payer: Self-pay | Admitting: Nurse Practitioner

## 2020-12-10 ENCOUNTER — Ambulatory Visit (INDEPENDENT_AMBULATORY_CARE_PROVIDER_SITE_OTHER): Payer: Medicare Other | Admitting: Nurse Practitioner

## 2020-12-10 VITALS — BP 108/64 | HR 89 | Temp 96.9°F | Ht 62.0 in | Wt 156.0 lb

## 2020-12-10 DIAGNOSIS — E039 Hypothyroidism, unspecified: Secondary | ICD-10-CM | POA: Diagnosis not present

## 2020-12-10 DIAGNOSIS — I10 Essential (primary) hypertension: Secondary | ICD-10-CM | POA: Diagnosis not present

## 2020-12-10 DIAGNOSIS — R5383 Other fatigue: Secondary | ICD-10-CM | POA: Diagnosis not present

## 2020-12-10 NOTE — Progress Notes (Signed)
Careteam: Patient Care Team: Lauree Chandler, NP as PCP - General (Geriatric Medicine)  PLACE OF SERVICE:  [place]  Advanced Directive information    Allergies  Allergen Reactions  . Ace Inhibitors Other (See Comments)  . Iodinated Diagnostic Agents   . Iodine     Iodine Contrast  . Other Rash    Chief Complaint  Patient presents with  . Acute Visit    Blood pressure concerns and feeling sluggish. Here with Daughter, Butch Penny      HPI: Patient is a 86 y.o. female for follow up.  Was feeling sluggish and daughter was monitoring her blood pressure which was a little low 100-132/67-89. One bp 150/77  Not eating, eating less 1000 calories a day due to trying to lose weight. Goal is 150 lbs.  Decrease energy.   No fevers, chills.  Review of Systems:  Review of Systems  Constitutional: Positive for malaise/fatigue. Negative for chills, fever and weight loss.  HENT: Negative for tinnitus.   Respiratory: Negative for cough, sputum production and shortness of breath.   Cardiovascular: Negative for chest pain, palpitations and leg swelling.  Gastrointestinal: Negative for abdominal pain, constipation, diarrhea and heartburn.  Genitourinary: Negative for dysuria, frequency and urgency.  Musculoskeletal: Negative for back pain, falls, joint pain and myalgias.  Skin: Negative.   Neurological: Negative for dizziness and headaches.  Psychiatric/Behavioral: Negative for depression and memory loss. The patient does not have insomnia.     Past Medical History:  Diagnosis Date  . Anxiety   . Asthma   . Bell's palsy   . Hearing loss bilateral  . Hypothyroidism   . Left wrist fracture   . Macular degeneration, wet ([place]) nxiety  . Non Hodgkin's lymphoma ([place])   . Osteoarthritis    Past Surgical History:  Procedure Laterality Date  . APPENDECTOMY    . CATARACT EXTRACTION    . CHOLECYSTECTOMY    . TONSILLECTOMY    . TUBAL LIGATION     Social History:   reports  that she has never smoked. She has never used smokeless tobacco. She reports that she does not drink alcohol and does not use drugs.  Family History  Problem Relation Age of Onset  . Throat cancer Mother   . Heart attack Father   . Lung cancer Sister   . Dementia Sister   . COPD Sister   . Obesity Daughter   . Pulmonary embolism Daughter   . Breast cancer Daughter   . Endometriosis Daughter   . Hashimoto's thyroiditis Daughter     Medications: Patient's Medications  New Prescriptions   No medications on file  Previous Medications   ALBUTEROL (VENTOLIN HFA) 108 (90 BASE) MCG/ACT INHALER    Inhale 2 puffs into the lungs daily as needed for wheezing or shortness of breath. Emergency use   EZETIMIBE (ZETIA) 10 MG TABLET    Take 1 tablet (10 mg total) by mouth daily.   FERROUS SULFATE 324 (65 FE) MG TBEC    Take by mouth 3 (three) times a week.   HYDROXYZINE (ATARAX/VISTARIL) 10 MG TABLET    Take 10 mg by mouth daily as needed.   LEVOTHYROXINE (SYNTHROID) 75 MCG TABLET    Take 1 tablet (75 mcg total) by mouth daily before breakfast.   MELOXICAM (MOBIC) 15 MG TABLET    TAKE ONE TABLET BY MOUTH DAILY   MULTIPLE VITAMINS-MINERALS (CENTRUM SILVER PO)    Take 1 tablet by mouth daily.   MULTIPLE  VITAMINS-MINERALS (PRESERVISION AREDS PO)    Take by mouth in the morning and at bedtime.   TIOTROPIUM BROMIDE-OLODATEROL (STIOLTO RESPIMAT) 2.5-2.5 MCG/ACT AERS    Inhale 2 puffs into the lungs daily.   VENLAFAXINE (EFFEXOR) 75 MG TABLET    Take 1 tablet (75 mg total) by mouth 2 (two) times daily.   VERAPAMIL (VERELAN PM) 120 MG 24 HR CAPSULE    Take 120 mg by mouth daily.  Modified Medications   No medications on file  Discontinued Medications   No medications on file    Physical Exam:  Vitals:   12/10/20 1522  BP: 108/64  Pulse: 89  Temp: (!) 96.9 F (36.1 C)  TempSrc: Temporal  SpO2: 96%  Weight: 156 lb (70.8 kg)  Height: 5\' 2"  (1.575 m)   Body mass index is 28.53 kg/m. Wt  Readings from Last 3 Encounters:  12/10/20 156 lb (70.8 kg)  10/05/20 156 lb 9.6 oz (71 kg)  08/21/20 155 lb 9.6 oz (70.6 kg)    Physical Exam Constitutional:      General: She is not in acute distress.    Appearance: She is well-developed. She is not diaphoretic.  HENT:     Head: Normocephalic and atraumatic.     Mouth/Throat:     Pharynx: No oropharyngeal exudate.  Eyes:     Conjunctiva/sclera: Conjunctivae normal.     Pupils: Pupils are equal, round, and reactive to light.  Cardiovascular:     Rate and Rhythm: Normal rate and regular rhythm.     Heart sounds: Normal heart sounds.  Pulmonary:     Effort: Pulmonary effort is normal.     Breath sounds: Normal breath sounds.  Abdominal:     General: Bowel sounds are normal.     Palpations: Abdomen is soft.  Musculoskeletal:        General: No tenderness.     Cervical back: Normal range of motion and neck supple.  Skin:    General: Skin is warm and dry.  Neurological:     Mental Status: She is alert. Mental status is at baseline.  Psychiatric:        Mood and Affect: Mood normal.     Labs reviewed: Basic Metabolic Panel: Recent Labs    08/08/20 0833  NA 141  K 4.1  CL 104  CO2 30  GLUCOSE 107*  BUN 13  CREATININE 0.78  CALCIUM 9.3  TSH 5.81*   Liver Function Tests: Recent Labs    08/08/20 0833  AST 17  ALT 14  BILITOT 0.5  PROT 6.3   No results for input(s): LIPASE, AMYLASE in the last 8760 hours. No results for input(s): AMMONIA in the last 8760 hours. CBC: Recent Labs    08/08/20 0833  WBC 5.9  NEUTROABS 2,679  HGB 13.8  HCT 42.7  MCV 92.0  PLT 290   Lipid Panel: Recent Labs    08/08/20 0833  CHOL 182  HDL 52  LDLCALC 107*  TRIG 115  CHOLHDL 3.5   TSH: Recent Labs    08/08/20 0833  TSH 5.81*   A1C: Lab Results  Component Value Date   HGBA1C 5.6 08/08/2020     Assessment/Plan 1. Primary hypertension -will have her titrate off verapamil. Blood pressures at home have  been well control and low normal. Could be contributing to feelings of fatigue and lethargy. To continue to monitor BP and HR Goal bp <140/90 - TSH - COMPLETE METABOLIC PANEL WITH GFR - CBC with  Differential/Platelet  2. Lethargic -likely multifactorial. TSH marginally elevated on last labs will follow up. Blood pressure borderline low, will stop verapamil to see if this helps.  -to increase calories in diet- suspect she is not getting enough protein and nutrition. - TSH - COMPLETE METABOLIC PANEL WITH GFR - CBC with Differential/Platelet  3. Acquired hypothyroidism -continues on synthroid but TSH was elevated on last check. Will follow up - TSH   Next appt: 04/01/2021 Carlos American. [place], [facility] [phone]

## 2020-12-10 NOTE — Patient Instructions (Signed)
To decrease verapamil every other day for 1 week then stop  Make sure you are eating a well balance diet with enough calories daily  More water Less caffeine

## 2020-12-11 ENCOUNTER — Other Ambulatory Visit: Payer: Self-pay | Admitting: Nurse Practitioner

## 2020-12-11 DIAGNOSIS — E039 Hypothyroidism, unspecified: Secondary | ICD-10-CM

## 2020-12-11 LAB — COMPLETE METABOLIC PANEL WITHOUT GFR
AG Ratio: 1.8 (calc) (ref 1.0–2.5)
ALT: 23 U/L (ref 6–29)
AST: 22 U/L (ref 10–35)
Albumin: 4.3 g/dL (ref 3.6–5.1)
Alkaline phosphatase (APISO): 75 U/L (ref 37–153)
BUN: 18 mg/dL (ref 7–25)
CO2: 25 mmol/L (ref 20–32)
Calcium: 9.5 mg/dL (ref 8.6–10.4)
Chloride: 104 mmol/L (ref 98–110)
Creat: 0.83 mg/dL (ref 0.60–0.88)
GFR, Est African American: 76 mL/min/{1.73_m2}
GFR, Est Non African American: 65 mL/min/{1.73_m2}
Globulin: 2.4 g/dL (ref 1.9–3.7)
Glucose, Bld: 93 mg/dL (ref 65–139)
Potassium: 4.3 mmol/L (ref 3.5–5.3)
Sodium: 140 mmol/L (ref 135–146)
Total Bilirubin: 0.4 mg/dL (ref 0.2–1.2)
Total Protein: 6.7 g/dL (ref 6.1–8.1)

## 2020-12-11 LAB — CBC WITH DIFFERENTIAL/PLATELET
Absolute Monocytes: 490 {cells}/uL (ref 200–950)
Basophils Absolute: 71 {cells}/uL (ref 0–200)
Basophils Relative: 1 %
Eosinophils Absolute: 170 {cells}/uL (ref 15–500)
Eosinophils Relative: 2.4 %
HCT: 42.3 % (ref 35.0–45.0)
Hemoglobin: 13.7 g/dL (ref 11.7–15.5)
Lymphs Abs: 3081 {cells}/uL (ref 850–3900)
MCH: 29.5 pg (ref 27.0–33.0)
MCHC: 32.4 g/dL (ref 32.0–36.0)
MCV: 91 fL (ref 80.0–100.0)
MPV: 9.7 fL (ref 7.5–12.5)
Monocytes Relative: 6.9 %
Neutro Abs: 3287 {cells}/uL (ref 1500–7800)
Neutrophils Relative %: 46.3 %
Platelets: 295 10*3/uL (ref 140–400)
RBC: 4.65 Million/uL (ref 3.80–5.10)
RDW: 14.5 % (ref 11.0–15.0)
Total Lymphocyte: 43.4 %
WBC: 7.1 10*3/uL (ref 3.8–10.8)

## 2020-12-11 LAB — TSH: TSH: 7.32 m[IU]/L — ABNORMAL HIGH (ref 0.40–4.50)

## 2020-12-11 MED ORDER — LEVOTHYROXINE SODIUM 88 MCG PO TABS: 88.0000 ug | ORAL_TABLET | Freq: Every day | ORAL | 3 refills | Status: DC

## 2021-03-12 ENCOUNTER — Ambulatory Visit
Admission: RE | Admit: 2021-03-12 | Discharge: 2021-03-12 | Disposition: A | Discharge status: Home / Self Care | Payer: Medicare Other | Source: Ambulatory Visit | Attending: Nurse Practitioner | Admitting: Nurse Practitioner

## 2021-03-12 ENCOUNTER — Other Ambulatory Visit: Payer: Self-pay

## 2021-03-12 DIAGNOSIS — E2839 Other primary ovarian failure: Secondary | ICD-10-CM

## 2021-04-01 ENCOUNTER — Other Ambulatory Visit: Payer: Self-pay

## 2021-04-01 ENCOUNTER — Other Ambulatory Visit: Payer: Medicare Other

## 2021-04-01 DIAGNOSIS — E785 Hyperlipidemia, unspecified: Secondary | ICD-10-CM

## 2021-04-01 DIAGNOSIS — I10 Essential (primary) hypertension: Secondary | ICD-10-CM

## 2021-04-01 DIAGNOSIS — E039 Hypothyroidism, unspecified: Secondary | ICD-10-CM

## 2021-04-01 LAB — CBC WITH DIFFERENTIAL/PLATELET
Absolute Monocytes: 455 cells/uL (ref 200–950)
Basophils Absolute: 48 cells/uL (ref 0–200)
Basophils Relative: 0.7 %
Eosinophils Absolute: 242 cells/uL (ref 15–500)
Eosinophils Relative: 3.5 %
HCT: 45.3 % — ABNORMAL HIGH (ref 35.0–45.0)
Hemoglobin: 14.7 g/dL (ref 11.7–15.5)
Lymphs Abs: 2498 cells/uL (ref 850–3900)
MCH: 29.9 pg (ref 27.0–33.0)
MCHC: 32.5 g/dL (ref 32.0–36.0)
MCV: 92.3 fL (ref 80.0–100.0)
MPV: 9.6 fL (ref 7.5–12.5)
Monocytes Relative: 6.6 %
Neutro Abs: 3657 cells/uL (ref 1500–7800)
Neutrophils Relative %: 53 %
Platelets: 280 10*3/uL (ref 140–400)
RBC: 4.91 10*6/uL (ref 3.80–5.10)
RDW: 14.1 % (ref 11.0–15.0)
Total Lymphocyte: 36.2 %
WBC: 6.9 10*3/uL (ref 3.8–10.8)

## 2021-04-01 LAB — COMPLETE METABOLIC PANEL WITH GFR
AG Ratio: 1.8 (calc) (ref 1.0–2.5)
ALT: 15 U/L (ref 6–29)
AST: 15 U/L (ref 10–35)
Albumin: 4.1 g/dL (ref 3.6–5.1)
Alkaline phosphatase (APISO): 72 U/L (ref 37–153)
BUN: 14 mg/dL (ref 7–25)
CO2: 27 mmol/L (ref 20–32)
Calcium: 9.2 mg/dL (ref 8.6–10.4)
Chloride: 106 mmol/L (ref 98–110)
Creat: 0.75 mg/dL (ref 0.60–0.95)
Globulin: 2.3 g/dL (calc) (ref 1.9–3.7)
Glucose, Bld: 117 mg/dL — ABNORMAL HIGH (ref 65–99)
Potassium: 4.5 mmol/L (ref 3.5–5.3)
Sodium: 140 mmol/L (ref 135–146)
Total Bilirubin: 0.5 mg/dL (ref 0.2–1.2)
Total Protein: 6.4 g/dL (ref 6.1–8.1)
eGFR: 78 mL/min/{1.73_m2} (ref >=60)

## 2021-04-01 LAB — LIPID PANEL
Cholesterol: 174 mg/dL (ref <200)
HDL: 56 mg/dL (ref >=50)
LDL Cholesterol (Calc): 101 mg/dL (calc) — ABNORMAL HIGH
Non-HDL Cholesterol (Calc): 118 mg/dL (calc) (ref <130)
Total CHOL/HDL Ratio: 3.1 (calc) (ref <5.0)
Triglycerides: 81 mg/dL (ref <150)

## 2021-04-01 LAB — TSH: TSH: 0.38 mIU/L — ABNORMAL LOW (ref 0.40–4.50)

## 2021-04-05 ENCOUNTER — Encounter: Payer: Self-pay | Admitting: Nurse Practitioner

## 2021-04-05 ENCOUNTER — Ambulatory Visit (INDEPENDENT_AMBULATORY_CARE_PROVIDER_SITE_OTHER): Payer: Medicare Other | Admitting: Nurse Practitioner

## 2021-04-05 ENCOUNTER — Other Ambulatory Visit: Payer: Self-pay

## 2021-04-05 VITALS — BP 126/80 | HR 86 | Temp 97.3°F | Ht 62.0 in | Wt 149.0 lb

## 2021-04-05 DIAGNOSIS — R739 Hyperglycemia, unspecified: Secondary | ICD-10-CM

## 2021-04-05 DIAGNOSIS — E039 Hypothyroidism, unspecified: Secondary | ICD-10-CM

## 2021-04-05 DIAGNOSIS — R0683 Snoring: Secondary | ICD-10-CM | POA: Diagnosis not present

## 2021-04-05 DIAGNOSIS — I10 Essential (primary) hypertension: Secondary | ICD-10-CM

## 2021-04-05 DIAGNOSIS — E785 Hyperlipidemia, unspecified: Secondary | ICD-10-CM

## 2021-04-05 DIAGNOSIS — M8580 Other specified disorders of bone density and structure, unspecified site: Secondary | ICD-10-CM | POA: Insufficient documentation

## 2021-04-05 DIAGNOSIS — R011 Cardiac murmur, unspecified: Secondary | ICD-10-CM

## 2021-04-05 NOTE — Patient Instructions (Signed)
Lab work in 1 month

## 2021-04-05 NOTE — Progress Notes (Signed)
Careteam: Patient Care Team: Lauree Chandler, NP as PCP - General (Geriatric Medicine)  PLACE OF SERVICE:  [place] Directive information Does Patient Have a Medical Advance Directive?: No, Would patient like information on creating a medical advance directive?: Yes (MAU/Ambulatory/Procedural Areas - Information given)  Allergies  Allergen Reactions   Ace Inhibitors Other (See Comments)   Iodinated Diagnostic Agents    Iodine     Iodine Contrast   Other Rash    Chief Complaint  Patient presents with   Medical Management of Chronic Issues    6 month follow-up and discuss recent labs (copy printed). Discuss need for shingrix and covid vaccines or exclude. Light-headed spells, possibly related to standing and walking too quickly. Patient also with visual migraines, tries OTC medications for relief. Possible sleep apnea, patient makes noises while sleeping. Here with Stanton Kidney and Butch Penny      HPI: Patient is a 86 y.o. female for routine follow up.   Htn- blood pressure has been well controlled at home. Off medication at this time.   Hypothyroid- TSH borderline low.   Reports daughter reports she is moving a lot in bed and some grunting noises. ?OSA feeling fatigue   Has stopped multivitamin.   Started cal and vit d. Also taking fruit and vegetable supplement.   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 Managing with tylenol and caffeine. No headache, no dizziness.  Followed by retina specialist due to macular degeneration.   Really was restrictive with diet due to wanting to lose weight. Eating better at this time.  Review of Systems:  Review of Systems  Constitutional:  Negative for chills, fever and weight loss.  HENT:  Negative for tinnitus.   Respiratory:  Negative for cough, sputum production and shortness of breath.   Cardiovascular:  Negative for chest pain, palpitations and leg swelling.  Gastrointestinal:  Negative for abdominal pain, constipation, diarrhea  and heartburn.  Genitourinary:  Negative for dysuria, frequency and urgency.  Musculoskeletal:  Positive for joint pain. Negative for back pain, falls and myalgias.  Skin: Negative.   Neurological:  Negative for dizziness and headaches.  Psychiatric/Behavioral:  Negative for depression and memory loss. The patient does not have insomnia.    Past Medical History:  Diagnosis Date   Anxiety    Asthma    Bell's palsy    Hearing loss bilateral   Hypothyroidism    Left wrist fracture    Macular degeneration, wet ([facility]) nxiety   Non Hodgkin's lymphoma ([place])    Osteoarthritis    Past Surgical History:  Procedure Laterality Date   APPENDECTOMY     CATARACT EXTRACTION     CHOLECYSTECTOMY     TONSILLECTOMY     TUBAL LIGATION     Social History:   reports that she has never smoked. She has never used smokeless tobacco. She reports that she does not drink alcohol and does not use drugs.  Family History  Problem Relation Age of Onset   Throat cancer Mother    Heart attack Father    Lung cancer Sister    Dementia Sister    COPD Sister    Obesity Daughter    Pulmonary embolism Daughter    Breast cancer Daughter    Endometriosis Daughter    Hashimoto's thyroiditis Daughter     Medications: Patient's Medications  New Prescriptions   No medications on file  Previous Medications   ALBUTEROL (VENTOLIN HFA) 108 (90 BASE) MCG/ACT INHALER    Inhale  2 puffs into the lungs daily as needed for wheezing or shortness of breath. Emergency use   CHOLECALCIFEROL (VITAMIN D3 PO)    Take 1 tablet by mouth in the morning and at bedtime.   EZETIMIBE (ZETIA) 10 MG TABLET    Take 1 tablet (10 mg total) by mouth daily.   FERROUS SULFATE 324 (65 FE) MG TBEC    Take by mouth 3 (three) times a week.   HYDROXYZINE (ATARAX/VISTARIL) 10 MG TABLET    Take 10 mg by mouth daily as needed.   LEVOTHYROXINE (SYNTHROID) 88 MCG TABLET    Take 1 tablet (88 mcg total) by mouth daily.   MELOXICAM (MOBIC) 15 MG TABLET     TAKE ONE TABLET BY MOUTH DAILY   MULTIPLE VITAMINS-MINERALS (PRESERVISION AREDS PO)    Take by mouth in the morning and at bedtime.   TIOTROPIUM BROMIDE-OLODATEROL (STIOLTO RESPIMAT) 2.5-2.5 MCG/ACT AERS    Inhale 2 puffs into the lungs daily.   UNABLE TO FIND    Med Name: [employer] fruit/Vegetables supplement once daily   VENLAFAXINE (EFFEXOR) 75 MG TABLET    Take 1 tablet (75 mg total) by mouth 2 (two) times daily.  Modified Medications   No medications on file  Discontinued Medications   MULTIPLE VITAMINS-MINERALS (CENTRUM SILVER PO)    Take 1 tablet by mouth daily.    Physical Exam:  Vitals:   04/05/21 1510  BP: 126/80  Pulse: 86  Temp: (!) 97.3 F (36.3 C)  TempSrc: Temporal  SpO2: 94%  Weight: 149 lb (67.6 kg)  Height: '5\' 2"'$  (1.575 m)   Body mass index is 27.25 kg/m. Wt Readings from Last 3 Encounters:  04/05/21 149 lb (67.6 kg)  12/10/20 156 lb (70.8 kg)  10/05/20 156 lb 9.6 oz (71 kg)    Physical Exam Constitutional:      General: She is not in acute distress.    Appearance: She is well-developed. She is not diaphoretic.  HENT:     Head: Normocephalic and atraumatic.     Mouth/Throat:     Pharynx: No oropharyngeal exudate.  Eyes:     Conjunctiva/sclera: Conjunctivae normal.     Pupils: Pupils are equal, round, and reactive to light.  Cardiovascular:     Rate and Rhythm: Normal rate and regular rhythm.     Heart sounds: Normal heart sounds. No murmur heard. Pulmonary:     Effort: Pulmonary effort is normal.     Breath sounds: Normal breath sounds.  Abdominal:     General: Bowel sounds are normal.     Palpations: Abdomen is soft.  Musculoskeletal:     Cervical back: Normal range of motion and neck supple.     Right lower leg: No edema.     Left lower leg: No edema.  Skin:    General: Skin is warm and dry.  Neurological:     Mental Status: She is alert and oriented to person, place, and time.  Psychiatric:        Mood and Affect: Mood normal.     Labs reviewed: Basic Metabolic Panel: Recent Labs    08/08/20 0833 12/10/20 1550 04/01/21 0835  NA 141 140 140  K 4.1 4.3 4.5  CL 104 104 106  CO2 '30 25 27  '$ GLUCOSE 107* 93 117*  BUN '13 18 14  '$ CREATININE 0.78 0.83 0.75  CALCIUM 9.3 9.5 9.2  TSH 5.81* 7.32* 0.38*   Liver Function Tests: Recent Labs    08/08/20  [id number] 12/10/20 1550 04/01/21 0835  AST '17 22 15  '$ ALT '14 23 15  '$ BILITOT 0.5 0.4 0.5  PROT 6.3 6.7 6.4   No results for input(s): LIPASE, AMYLASE in the last 8760 hours. No results for input(s): AMMONIA in the last 8760 hours. CBC: Recent Labs    08/08/20 0833 12/10/20 1550 04/01/21 0835  WBC 5.9 7.1 6.9  NEUTROABS 2,679 3,287 3,657  HGB 13.8 13.7 14.7  HCT 42.7 42.3 45.3*  MCV 92.0 91.0 92.3  PLT 290 295 280   Lipid Panel: Recent Labs    08/08/20 0833 04/01/21 0835  CHOL 182 174  HDL 52 56  LDLCALC 107* 101*  TRIG 115 81  CHOLHDL 3.5 3.1   TSH: Recent Labs    08/08/20 0833 12/10/20 1550 04/01/21 0835  TSH 5.81* 7.32* 0.38*   A1C: Lab Results  Component Value Date   HGBA1C 5.6 08/08/2020     Assessment/Plan 1. Snores -with increase in fatigue. Will have her evaluated for OSA - Ambulatory referral to Pulmonology  2. Acquired hypothyroidism -TSH now on the low side, will recheck in 1 months and if remains lows will reduce synthroid.  - TSH; Future  3. Hyperlipidemia LDL goal <100 -continues on zetia. LDL close to goal. Continue dietary modifications.   4. Hyperglycemia Continue dietary modifications. - Hemoglobin A1c; Future  5. Murmur, cardiac -no chest pains or shortness of breath noted - ECHOCARDIOGRAM COMPLETE; Future  6. Primary hypertension Controlled off medication.   7. Osteopenia, unspecified location -Recommended to take caltrate with D 600/400 twice a day with weight bearing activity.   Next appt: 4 months.- labs in 1 month Durand Wittmeyer K. [place], [facility] [phone] -

## 2021-04-07 ENCOUNTER — Other Ambulatory Visit: Payer: Self-pay | Admitting: Orthopaedic Surgery

## 2021-04-07 ENCOUNTER — Other Ambulatory Visit: Payer: Self-pay | Admitting: Nurse Practitioner

## 2021-04-10 ENCOUNTER — Other Ambulatory Visit: Payer: Self-pay

## 2021-04-10 ENCOUNTER — Ambulatory Visit (HOSPITAL_COMMUNITY)
Admission: RE | Admit: 2021-04-10 | Discharge: 2021-04-10 | Disposition: A | Discharge status: Home / Self Care | Payer: Medicare Other | Source: Ambulatory Visit | Attending: Nurse Practitioner | Admitting: Nurse Practitioner

## 2021-04-10 ENCOUNTER — Other Ambulatory Visit: Payer: Self-pay | Admitting: Nurse Practitioner

## 2021-04-10 DIAGNOSIS — I350 Nonrheumatic aortic (valve) stenosis: Secondary | ICD-10-CM | POA: Diagnosis not present

## 2021-04-10 DIAGNOSIS — R011 Cardiac murmur, unspecified: Secondary | ICD-10-CM | POA: Insufficient documentation

## 2021-04-10 LAB — ECHOCARDIOGRAM COMPLETE
AR max vel: 1.25 cm2
AV Area VTI: 1.11 cm2
AV Area mean vel: 1.22 cm2
AV Mean grad: 9.8 mmHg
AV Peak grad: 17.1 mmHg
Ao pk vel: 2.07 m/s
Area-P 1/2: 3.31 cm2
MV VTI: 1.74 cm2
S' Lateral: 3.4 cm

## 2021-05-03 ENCOUNTER — Other Ambulatory Visit: Payer: Medicare Other

## 2021-05-03 DIAGNOSIS — R739 Hyperglycemia, unspecified: Secondary | ICD-10-CM

## 2021-05-03 DIAGNOSIS — E039 Hypothyroidism, unspecified: Secondary | ICD-10-CM

## 2021-05-10 ENCOUNTER — Other Ambulatory Visit: Payer: Self-pay

## 2021-05-10 ENCOUNTER — Other Ambulatory Visit: Payer: Medicare Other

## 2021-05-11 LAB — TSH: TSH: 0.49 mIU/L (ref 0.40–4.50)

## 2021-05-11 LAB — HEMOGLOBIN A1C
Hgb A1c MFr Bld: 5.9 % of total Hgb — ABNORMAL HIGH (ref <5.7)
Mean Plasma Glucose: 123 mg/dL
eAG (mmol/L): 6.8 mmol/L

## 2021-06-22 NOTE — Progress Notes (Signed)
Cardiology Office Note:    Date:  06/24/2021   ID:  Barbara, Lopez 12-May-1935, MRN [id number]  PCP:  Lauree Chandler, NP  Cardiologist:  None   Referring MD: Lauree Chandler, NP   Chief Complaint  Patient presents with   Cardiac Valve Problem    Aortic stenosis     History of Present Illness:    Barbara Lopez is a 86 y.o. female with a hx of primary hypertension, hyperlipidemia, and systolic murmur. Referred for consultation of aortic stenosis.  She has no major complaints and when I ask why she is here, she points to her daughter.  She denies shortness of breath any different than what she has been chronically accustomed to related to chronic asthma.  She denies orthopnea and edema.  She is not having chest pain and has not had syncope.  She is still ambulatory.  She was told of the heart murmur by Dr. Sherrie Mustache.  She has previously seen a cardiologist in the past and states that a monitor was performed.  The daughter points out that she occasionally has rapid episodes of heart rate that seem to resolve quickly and happen no more than once per month.  He has no prior diagnosis of atrial fibrillation.  Past Medical History:  Diagnosis Date   Anxiety    Asthma    Bell's palsy    Hearing loss bilateral   Hypothyroidism    Left wrist fracture    Macular degeneration, wet (HCC) nxiety   Non Hodgkin's lymphoma ([place])    Osteoarthritis     Past Surgical History:  Procedure Laterality Date   APPENDECTOMY     CATARACT EXTRACTION     CHOLECYSTECTOMY     TONSILLECTOMY     TUBAL LIGATION      Current Medications: Current Meds  Medication Sig   albuterol (VENTOLIN HFA) 108 (90 Base) MCG/ACT inhaler Inhale 2 puffs into the lungs daily as needed for wheezing or shortness of breath. Emergency use   Cholecalciferol (VITAMIN D3 PO) Take 1 tablet by mouth in the morning and at bedtime.   ezetimibe (ZETIA) 10 MG tablet TAKE ONE TABLET BY MOUTH DAILY   hydrOXYzine  (ATARAX/VISTARIL) 10 MG tablet Take 10 mg by mouth daily as needed.   levothyroxine (SYNTHROID) 88 MCG tablet Take 1 tablet (88 mcg total) by mouth daily.   meloxicam (MOBIC) 15 MG tablet TAKE ONE TABLET BY MOUTH DAILY   Multiple Vitamins-Minerals (PRESERVISION AREDS PO) Take by mouth in the morning and at bedtime.   Tiotropium Bromide-Olodaterol (STIOLTO RESPIMAT) 2.5-2.5 MCG/ACT AERS Inhale 2 puffs into the lungs daily.   UNABLE TO FIND Med Name: Tree of Live fruit/Vegetables supplement once daily   venlafaxine (EFFEXOR) 75 MG tablet TAKE ONE TABLET BY MOUTH TWICE A DAY     Allergies:   Ace inhibitors, Iodinated diagnostic agents, Iodine, and Other   Social History   Socioeconomic History   Marital status: Widowed    Spouse name: Not on file   Number of children: Not on file   Years of education: Not on file   Highest education level: Not on file  Occupational History   Not on file  Tobacco Use   Smoking status: Never   Smokeless tobacco: Never  Vaping Use   Vaping Use: Never used  Substance and Sexual Activity   Alcohol use: Never   Drug use: Never   Sexual activity: Not Currently  Other Topics Concern   Not  on file  Social History Narrative   Diet: Regular      Do you drink/ eat things with caffeine? Coffee      Marital status:   Widowed                             What year were you married ? 1957      Do you live in a house, apartment,assistred living, condo, trailer, etc.)?  House      Is it one or more stories? 2 Stories      How many persons live in your home ? 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  House Work and Walk to [employer]                            Type & how often  Daily      ADVANCED DIRECTIVES (Please bring copies)      Do you have a living will? No      Do you have a DNR form? No                       If not, do you want to discuss one? Yes      Do you have signed POA?HPOA forms?   No              If so, please bring to your appointment      FUNCTIONAL STATUS- To be completed by Spouse / child / Staff       Do you have difficulty bathing or dressing yourself ?  No      Do you have difficulty preparing food or eating ?  No      Do you have difficulty managing your mediation ?  No      Do you have difficulty managing your finances ?  No      Do you have difficulty affording your medication ?  No      Social Determinants of Radio broadcast assistant Strain: Not on file  Food Insecurity: Not on file  Transportation Needs: Not on file  Physical Activity: Not on file  Stress: Not on file  Social Connections: Not on file     Family History: The patient's family history includes Breast cancer in her daughter; COPD in her sister; Dementia in her sister; Endometriosis in her daughter; Hashimoto's thyroiditis in her daughter; Heart attack in her father; Lung cancer in her sister; Obesity in her daughter; Pulmonary embolism in her daughter; Throat cancer in her mother.  ROS:   Please see the history of present illness.     Lightheaded on occasion when she arises from a sitting position.  All other systems reviewed and are negative.  EKGs/Labs/Other Studies Reviewed:    The following studies were reviewed today:  2 D Doppler ECHOCARDIOGRAM 04/10/2021: IMPRESSIONS     1. Left ventricular ejection fraction, by estimation, is 55 to 60%. The  left ventricle has normal function. The left ventricle has no regional  wall motion abnormalities. Left ventricular diastolic parameters are  consistent with Grade I diastolic  dysfunction (impaired relaxation).  2. Right ventricular systolic function is normal. The right ventricular  size is normal.   3. The mitral valve is degenerative. No evidence of mitral valve  regurgitation. Mild mitral stenosis. The mean mitral valve gradient  is 4.0  mmHg at HR 82bpm, MVA 1.7 cm^2 by continuity equation. Moderate mitral  annular calcification.   4. The aortic valve was not well visualized. Aortic valve regurgitation  is not visualized. Mild to moderate aortic valve stenosis. Mild AS by  gradients (MG 32mmHg, Vmax 2.1 m/s), moderate by AVA (1.1 cm^2) and DI  (0.32).   5. The inferior vena cava is normal in size with greater than 50%  respiratory variability, suggesting right atrial pressure of 3 mmHg.   EKG:  EKG normal sinus rhythm/sinus tachycardia with normal PR interval, left axis deviation, poor R wave progression V1 through V5.  No prior tracing to compare  Recent Labs: 04/01/2021: ALT 15; BUN 14; Creat 0.75; Hemoglobin 14.7; Platelets 280; Potassium 4.5; Sodium 140 05/10/2021: TSH 0.49  Recent Lipid Panel    Component Value Date/Time   CHOL 174 04/01/2021 0835   TRIG 81 04/01/2021 0835   HDL 56 04/01/2021 0835   CHOLHDL 3.1 04/01/2021 0835   LDLCALC 101 (H) 04/01/2021 0835    Physical Exam:    VS:  BP 110/68   Pulse (!) 102   Ht 5\' 2"  (1.575 m)   Wt 146 lb 3.2 oz (66.3 kg)   SpO2 97%   BMI 26.74 kg/m     Wt Readings from Last 3 Encounters:  06/24/21 146 lb 3.2 oz (66.3 kg)  04/05/21 149 lb (67.6 kg)  12/10/20 156 lb (70.8 kg)     GEN: Compatible with age.  Overweight.. No acute distress HEENT: Normal NECK: No JVD.  No bruits are heard.  No transmission of the aortic murmur is heard. LYMPHATICS: No lymphadenopathy CARDIAC: Soft 2/6 left mid sternal systolic murmur.  Heart rate is increased to approximately 100 to 105 bpm.  RRR no gallop, or edema. VASCULAR:  Normal Pulses. No bruits. RESPIRATORY:  Clear to auscultation without rales, wheezing or rhonchi  ABDOMEN: Soft, non-tender, non-distended, No pulsatile mass, MUSCULOSKELETAL: No deformity  SKIN: Warm and dry NEUROLOGIC:  Alert and oriented x 3 PSYCHIATRIC:  Normal affect   ASSESSMENT:    1. Nonrheumatic aortic valve stenosis   2. Primary  hypertension   3. Hyperlipidemia LDL goal <100   4. Snoring    PLAN:    In order of problems listed above:  Mild aortic stenosis based upon clinical features and echocardiogram.  Discussed the cardinal symptoms of aortic stenosis including syncope, dyspnea, and angina.  Discussed serial follow-up.  Have recommended 2D Doppler echocardiogram to be repeated in 1 year in anticipation of follow-up shortly thereafter. Blood pressure is excellent.  She is on no antihypertensive therapy.  Therefore, this diagnosis is questionable.   LDL is 101 with total cholesterol 174.  At her age, these numbers are quite good.  She is on Zetia 10 mg/day. Did not discuss snoring.   Natural history of aortic valve stenosis was discussed in detail.  Cardinal symptoms of angina, syncope, and dyspnea were reviewed and significance relative to prognosis was described.  The importance of sequential imaging for disease monitoring was emphasized.  Work-up including possible heart catheterization and CT angiography were described as essential components of staging for therapy.  Treatment options, TAVR and SAVR, were discussed in some detail with emphasis on TAVR.    Medication Adjustments/Labs and  Tests Ordered: Current medicines are reviewed at length with the patient today.  Concerns regarding medicines are outlined above.  Orders Placed This Encounter  Procedures   EKG 12-Lead   ECHOCARDIOGRAM COMPLETE    No orders of the defined types were placed in this encounter.   Patient Instructions  Medication Instructions:  Your physician recommends that you continue on your current medications as directed. Please refer to the Current Medication list given to you today.  *If you need a refill on your cardiac medications before your next appointment, please call your pharmacy*   Lab Work: None If you have labs (blood work) drawn today and your tests are completely normal, you will receive your results only  by: [place] (if you have MyChart) OR A paper copy in the mail If you have any lab test that is abnormal or we need to change your treatment, we will call you to review the results.   Testing/Procedures: Your physician has requested that you have an echocardiogram 1-2 weeks prior to seeing Dr. Tamala Julian back in one year. Echocardiography is a painless test that uses sound waves to create images of your heart. It provides your doctor with information about the size and shape of your heart and how well your heart's chambers and valves are working. This procedure takes approximately one hour. There are no restrictions for this procedure.   Follow-Up: At [facility], you and your health needs are our priority.  As part of our continuing mission to provide you with exceptional heart care, we have created designated Provider Care Teams.  These Care Teams include your primary Cardiologist (physician) and Advanced Practice Providers (APPs -  Physician Assistants and Nurse Practitioners) who all work together to provide you with the care you need, when you need it.  We recommend signing up for the patient portal called "MyChart".  Sign up information is provided on this After Visit Summary.  MyChart is used to connect with patients for Virtual Visits (Telemedicine).  Patients are able to view lab/test results, encounter notes, upcoming appointments, etc.  Non-urgent messages can be sent to your provider as well.   To learn more about what you can do with MyChart, go to [email or web address].    Your next appointment:   1 year(s)  The format for your next appointment:   In Person  Provider:   You may see Daneen Schick, III, MD or one of the following Advanced Practice Providers on your designated Care Team:   Cecilie Kicks, NP   Other Instructions     Signed, Sinclair Grooms, MD  06/24/2021 10:30 AM    [place]

## 2021-06-24 ENCOUNTER — Encounter: Payer: Self-pay | Admitting: Interventional Cardiology

## 2021-06-24 ENCOUNTER — Ambulatory Visit (INDEPENDENT_AMBULATORY_CARE_PROVIDER_SITE_OTHER): Payer: Medicare Other | Admitting: Interventional Cardiology

## 2021-06-24 ENCOUNTER — Other Ambulatory Visit: Payer: Self-pay

## 2021-06-24 VITALS — BP 110/68 | HR 102 | Ht 62.0 in | Wt 146.2 lb

## 2021-06-24 DIAGNOSIS — I350 Nonrheumatic aortic (valve) stenosis: Secondary | ICD-10-CM | POA: Diagnosis not present

## 2021-06-24 DIAGNOSIS — R0683 Snoring: Secondary | ICD-10-CM

## 2021-06-24 DIAGNOSIS — I10 Essential (primary) hypertension: Secondary | ICD-10-CM

## 2021-06-24 DIAGNOSIS — E785 Hyperlipidemia, unspecified: Secondary | ICD-10-CM

## 2021-06-24 NOTE — Patient Instructions (Signed)
Medication Instructions:  Your physician recommends that you continue on your current medications as directed. Please refer to the Current Medication list given to you today.  *If you need a refill on your cardiac medications before your next appointment, please call your pharmacy*   Lab Work: None If you have labs (blood work) drawn today and your tests are completely normal, you will receive your results only by: [place] (if you have MyChart) OR A paper copy in the mail If you have any lab test that is abnormal or we need to change your treatment, we will call you to review the results.   Testing/Procedures: Your physician has requested that you have an echocardiogram 1-2 weeks prior to seeing Dr. Tamala Julian back in one year. Echocardiography is a painless test that uses sound waves to create images of your heart. It provides your doctor with information about the size and shape of your heart and how well your heart's chambers and valves are working. This procedure takes approximately one hour. There are no restrictions for this procedure.   Follow-Up: At [facility], you and your health needs are our priority.  As part of our continuing mission to provide you with exceptional heart care, we have created designated Provider Care Teams.  These Care Teams include your primary Cardiologist (physician) and Advanced Practice Providers (APPs -  Physician Assistants and Nurse Practitioners) who all work together to provide you with the care you need, when you need it.  We recommend signing up for the patient portal called "MyChart".  Sign up information is provided on this After Visit Summary.  MyChart is used to connect with patients for Virtual Visits (Telemedicine).  Patients are able to view lab/test results, encounter notes, upcoming appointments, etc.  Non-urgent messages can be sent to your provider as well.   To learn more about what you can do with MyChart, go to  [email or web address].    Your next appointment:   1 year(s)  The format for your next appointment:   In Person  Provider:   You may see Daneen Schick, III, MD or one of the following Advanced Practice Providers on your designated Care Team:   Cecilie Kicks, NP   Other Instructions

## 2021-07-07 ENCOUNTER — Other Ambulatory Visit: Payer: Self-pay | Admitting: Orthopaedic Surgery

## 2021-07-08 NOTE — Telephone Encounter (Signed)
ok 

## 2021-07-12 ENCOUNTER — Other Ambulatory Visit: Payer: Self-pay | Admitting: *Deleted

## 2021-07-12 MED ORDER — HYDROXYZINE HCL 10 MG PO TABS: 10.0000 mg | ORAL_TABLET | Freq: Every day | ORAL | 3 refills | Status: DC | PRN

## 2021-07-12 NOTE — Telephone Encounter (Signed)
Butch Penny, daughter called and stated that patient needs a refill on her Hydroxyzine.   Stated that patient just takes it as needed and when she has to go for her Eye Injections.   Patient is out and needs a refill.  Pended Rx and sent to [facility] for approval.

## 2021-08-05 ENCOUNTER — Ambulatory Visit (INDEPENDENT_AMBULATORY_CARE_PROVIDER_SITE_OTHER): Payer: Medicare Other | Admitting: Nurse Practitioner

## 2021-08-05 ENCOUNTER — Encounter: Payer: Self-pay | Admitting: Nurse Practitioner

## 2021-08-05 ENCOUNTER — Other Ambulatory Visit: Payer: Self-pay

## 2021-08-05 VITALS — BP 130/70 | HR 82 | Temp 97.1°F | Ht 62.0 in | Wt 150.2 lb

## 2021-08-05 DIAGNOSIS — I10 Essential (primary) hypertension: Secondary | ICD-10-CM

## 2021-08-05 DIAGNOSIS — K219 Gastro-esophageal reflux disease without esophagitis: Secondary | ICD-10-CM | POA: Diagnosis not present

## 2021-08-05 DIAGNOSIS — M170 Bilateral primary osteoarthritis of knee: Secondary | ICD-10-CM

## 2021-08-05 DIAGNOSIS — F419 Anxiety disorder, unspecified: Secondary | ICD-10-CM

## 2021-08-05 DIAGNOSIS — I350 Nonrheumatic aortic (valve) stenosis: Secondary | ICD-10-CM

## 2021-08-05 DIAGNOSIS — E039 Hypothyroidism, unspecified: Secondary | ICD-10-CM

## 2021-08-05 LAB — COMPLETE METABOLIC PANEL WITH GFR
AG Ratio: 1.7 (calc) (ref 1.0–2.5)
ALT: 13 U/L (ref 6–29)
AST: 14 U/L (ref 10–35)
Albumin: 3.8 g/dL (ref 3.6–5.1)
Alkaline phosphatase (APISO): 71 U/L (ref 37–153)
BUN: 15 mg/dL (ref 7–25)
CO2: 28 mmol/L (ref 20–32)
Calcium: 8.9 mg/dL (ref 8.6–10.4)
Chloride: 107 mmol/L (ref 98–110)
Creat: 0.66 mg/dL (ref 0.60–0.95)
Globulin: 2.3 g/dL (calc) (ref 1.9–3.7)
Glucose, Bld: 103 mg/dL — ABNORMAL HIGH (ref 65–99)
Potassium: 4.3 mmol/L (ref 3.5–5.3)
Sodium: 141 mmol/L (ref 135–146)
Total Bilirubin: 0.6 mg/dL (ref 0.2–1.2)
Total Protein: 6.1 g/dL (ref 6.1–8.1)
eGFR: 86 mL/min/{1.73_m2} (ref >=60)

## 2021-08-05 MED ORDER — PANTOPRAZOLE SODIUM 40 MG PO TBEC
40.0000 mg | DELAYED_RELEASE_TABLET | Freq: Every day | ORAL | 3 refills | Status: DC
Start: 2021-08-05 — End: 2021-12-05

## 2021-08-05 NOTE — Patient Instructions (Signed)
To decrease meloxicam 7.5 mg by mouth daily (can take twice daily if needed)  To take tylenol 1000 mg by mouth every 8 hours as needed pain- okay to take with meloxicam   To start protonix 40 mg by mouth daily- take for at least 6 weeks to see if this helps with the "choking" episodes, acid reflux.

## 2021-08-05 NOTE — Progress Notes (Signed)
Careteam: Patient Care Team: Lauree Chandler, NP as PCP - General (Geriatric Medicine)  PLACE OF SERVICE:  [place]  Advanced Directive information    Allergies  Allergen Reactions   Ace Inhibitors Other (See Comments)   Iodinated Diagnostic Agents    Iodine     Iodine Contrast   Other Rash    Chief Complaint  Patient presents with   Medical Management of Chronic Issues    4 month follow up   Health Maintenance    Urine microalbumin, Zoster, 2nd COVID booster     HPI: Patient is a 86 y.o. female for routine follow up.  Pt with hx of htn, hyperlipidemia, AS, asthma, hypothyroid, anxiety.   She has been followed by cardiology due to AS- currently without symptoms of shortness of breath, chest paisn.   Hyperlipidemia- continues on zetia 10 mg daily   Htn- controlled on medication, low sodium diet.   Elevated blood sugar- a1c 5.9 on last lab.   Hypothyroid- TSH at goal in September. Continues on synthroid 88 mcg.   Anxiety/depression- stable on effexor. Continues to use hydrozyzine PRN when she gets her shots for her macular degeneration.    Osteoarthritis of the knee right more than left but bilateral pain. Taking mobic 15 mg daily   Reports some nights she will have "choking" episode and starts coughing but does not happen when she is eating or drinking only when she is sitting at night watching TV. Never happens when she eats or drinks.  ?if it is due to asthma. Happening almost every night.  Reports she does have some GERD , worse when she has overate.   Eating more foods, vs "bars"    Review of Systems:  Review of Systems  Constitutional:  Negative for chills, fever and weight loss.  HENT:  Negative for tinnitus.   Respiratory:  Negative for cough, sputum production and shortness of breath.   Cardiovascular:  Negative for chest pain, palpitations and leg swelling.  Gastrointestinal:  Positive for heartburn. Negative for abdominal pain, constipation  and diarrhea.  Genitourinary:  Negative for dysuria, frequency and urgency.  Musculoskeletal:  Negative for back pain, falls, joint pain and myalgias.  Skin: Negative.   Neurological:  Negative for dizziness and headaches.  Psychiatric/Behavioral:  Negative for depression and memory loss. The patient does not have insomnia.    Past Medical History:  Diagnosis Date   Anxiety    Asthma    Bell's palsy    Hearing loss bilateral   Hypothyroidism    Left wrist fracture    Macular degeneration, wet (HCC) nxiety   Non Hodgkin's lymphoma ([place])    Osteoarthritis    Past Surgical History:  Procedure Laterality Date   APPENDECTOMY     CATARACT EXTRACTION     CHOLECYSTECTOMY     TONSILLECTOMY     TUBAL LIGATION     Social History:   reports that she has never smoked. She has never used smokeless tobacco. She reports that she does not drink alcohol and does not use drugs.  Family History  Problem Relation Age of Onset   Throat cancer Mother    Heart attack Father    Lung cancer Sister    Dementia Sister    COPD Sister    Obesity Daughter    Pulmonary embolism Daughter    Breast cancer Daughter    Endometriosis Daughter    Hashimoto's thyroiditis Daughter     Medications: Patient's Medications  New  Prescriptions   PANTOPRAZOLE (PROTONIX) 40 MG TABLET    Take 1 tablet (40 mg total) by mouth daily.  Previous Medications   ALBUTEROL (VENTOLIN HFA) 108 (90 BASE) MCG/ACT INHALER    Inhale 2 puffs into the lungs daily as needed for wheezing or shortness of breath. Emergency use   CHOLECALCIFEROL (VITAMIN D3 PO)    Take 1 tablet by mouth in the morning and at bedtime.   EZETIMIBE (ZETIA) 10 MG TABLET    TAKE ONE TABLET BY MOUTH DAILY   HYDROXYZINE (ATARAX/VISTARIL) 10 MG TABLET    Take 1 tablet (10 mg total) by mouth daily as needed.   LEVOTHYROXINE (SYNTHROID) 88 MCG TABLET    Take 1 tablet (88 mcg total) by mouth daily.   MELOXICAM (MOBIC) 15 MG TABLET    TAKE ONE TABLET BY MOUTH  DAILY   MULTIPLE VITAMINS-MINERALS (PRESERVISION AREDS PO)    Take by mouth in the morning and at bedtime.   TIOTROPIUM BROMIDE-OLODATEROL (STIOLTO RESPIMAT) 2.5-2.5 MCG/ACT AERS    Inhale 2 puffs into the lungs daily.   UNABLE TO FIND    Med Name: Tree of Live fruit/Vegetables supplement once daily   VENLAFAXINE (EFFEXOR) 75 MG TABLET    TAKE ONE TABLET BY MOUTH TWICE A DAY  Modified Medications   No medications on file  Discontinued Medications   No medications on file    Physical Exam:  Vitals:   08/05/21 0838  BP: 130/70  Pulse: 82  Temp: (!) 97.1 F (36.2 C)  TempSrc: Temporal  SpO2: 97%  Weight: 150 lb 3.2 oz (68.1 kg)  Height: _0  (1.575 m)   Body mass index is 27.47 kg/m. Wt Readings from Last 3 Encounters:  08/05/21 150 lb 3.2 oz (68.1 kg)  06/24/21 146 lb 3.2 oz (66.3 kg)  04/05/21 149 lb (67.6 kg)    Physical Exam Constitutional:      General: She is not in acute distress.    Appearance: She is well-developed. She is not diaphoretic.  HENT:     Head: Normocephalic and atraumatic.     Mouth/Throat:     Pharynx: No oropharyngeal exudate.  Eyes:     Conjunctiva/sclera: Conjunctivae normal.     Pupils: Pupils are equal, round, and reactive to light.  Cardiovascular:     Rate and Rhythm: Normal rate and regular rhythm.     Heart sounds: Normal heart sounds.  Pulmonary:     Effort: Pulmonary effort is normal.     Breath sounds: Normal breath sounds.  Abdominal:     General: Bowel sounds are normal.     Palpations: Abdomen is soft.  Musculoskeletal:     Cervical back: Normal range of motion and neck supple.     Right lower leg: No edema.     Left lower leg: No edema.  Skin:    General: Skin is warm and dry.  Neurological:     Mental Status: She is alert.  Psychiatric:        Mood and Affect: Mood normal.    Labs reviewed: Basic Metabolic Panel: Recent Labs    08/08/20 0833 12/10/20 1550 04/01/21 0835 05/10/21 0854  NA 141 140 140  --   K  4.1 4.3 4.5  --   CL 104 104 106  --   CO2 _1 --   GLUCOSE 107* 93 117*  --   BUN _2 --   CREATININE 0.78 0.83 0.75  --  CALCIUM 9.3 9.5 9.2  --   TSH 5.81* 7.32* 0.38* 0.49   Liver Function Tests: Recent Labs    08/08/20 0833 12/10/20 1550 04/01/21 0835  AST _0 ALT _1 BILITOT 0.5 0.4 0.5  PROT 6.3 6.7 6.4   No results for input(s): LIPASE, AMYLASE in the last 8760 hours. No results for input(s): AMMONIA in the last 8760 hours. CBC: Recent Labs    08/08/20 0833 12/10/20 1550 04/01/21 0835  WBC 5.9 7.1 6.9  NEUTROABS 2,679 3,287 3,657  HGB 13.8 13.7 14.7  HCT 42.7 42.3 45.3*  MCV 92.0 91.0 92.3  PLT 290 295 280   Lipid Panel: Recent Labs    08/08/20 0833 04/01/21 0835  CHOL 182 174  HDL 52 56  LDLCALC 107* 101*  TRIG 115 81  CHOLHDL 3.5 3.1   TSH: Recent Labs    12/10/20 1550 04/01/21 0835 05/10/21 0854  TSH 7.32* 0.38* 0.49   A1C: Lab Results  Component Value Date   HGBA1C 5.9 (H) 05/10/2021     Assessment/Plan 1. Primary hypertension -Blood pressure well controlled Continue current medications Recheck metabolic panel - CMP with eGFR([facility])  2. Primary osteoarthritis of both knees Currently on mobic per ortho. Educated on risk of medication, adverse effects of long term use. Would like her to decreae to half tablet daily (to take additional half if needed for severe pain) and to take tylenol 1000 mg by mouth every 8 hours PRN (can take in addition to mobic).  - CMP with eGFR([facility])  3. Gastroesophageal reflux disease without esophagitis -question if episodes are not GERD related due to the fact she has some symptoms of indigestion and cough with episodes. -she will try protonix for at least 6 weeks to see if this improves symptoms.  - pantoprazole (PROTONIX) 40 MG tablet; Take 1 tablet (40 mg total) by mouth daily.  Dispense: 30 tablet; Refill: 3  4. Nonrheumatic aortic valve stenosis Stable, has seen  cardiology and will continue to follow up.   5. Acquired hypothyroidism Continues on synthroid 88 mcg  6. Anxiety Stable on effexor 75 mcg.    Next appt: 6 months.  Carlos American. [place], [facility] [phone]

## 2021-09-19 ENCOUNTER — Telehealth: Payer: Self-pay

## 2021-09-19 ENCOUNTER — Encounter: Payer: Self-pay | Admitting: Nurse Practitioner

## 2021-09-19 ENCOUNTER — Other Ambulatory Visit: Payer: Self-pay

## 2021-09-19 ENCOUNTER — Ambulatory Visit (INDEPENDENT_AMBULATORY_CARE_PROVIDER_SITE_OTHER): Payer: Medicare Other | Admitting: Nurse Practitioner

## 2021-09-19 DIAGNOSIS — Z0000 Encounter for general adult medical examination without abnormal findings: Secondary | ICD-10-CM | POA: Diagnosis not present

## 2021-09-19 NOTE — Progress Notes (Signed)
This service is provided via telemedicine  No vital signs collected/recorded due to the encounter was a telemedicine visit.   Location of patient (ex: home, work):  Home  Patient consents to a telephone visit:  Yes, see encounter dated 01/12/20233  Location of the provider (ex: office, home):  [place]  Name of any referring provider:  N/A  Names of all persons participating in the telemedicine service and their role in the encounter:  Sherrie Mustache, Nurse Practitioner, Carroll Kinds, CMA, patient and daughter, Butch Penny  Time spent on call:  8 minutes with medical assistant

## 2021-09-19 NOTE — Telephone Encounter (Signed)
Message routed to [place], Jessica K, NP  

## 2021-09-19 NOTE — Patient Instructions (Signed)
Barbara Lopez , Thank you for taking time to come for your Medicare Wellness Visit. I appreciate your ongoing commitment to your health goals. Please review the following plan we discussed and let me know if I can assist you in the future.   Screening recommendations/referrals: Colonoscopy aged out Mammogram aged out Bone Density up to date Recommended yearly ophthalmology/optometry visit for glaucoma screening and checkup Recommended yearly dental visit for hygiene and checkup  Vaccinations: Influenza vaccine up to date Pneumococcal vaccine up to date Tdap vaccine up to date Shingles vaccine RECOMMENDED- to get at local pharmacy     Advanced directives: completed MOST today  Conditions/risks identified: advance age, family hx of premature cardiovascular disease   Next appointment: yearly for awv    Preventive Care 48 Years and Older, Female Preventive care refers to lifestyle choices and visits with your health care provider that can promote health and wellness. What does preventive care include? A yearly physical exam. This is also called an annual well check. Dental exams once or twice a year. Routine eye exams. Ask your health care provider how often you should have your eyes checked. Personal lifestyle choices, including: Daily care of your teeth and gums. Regular physical activity. Eating a healthy diet. Avoiding tobacco and drug use. Limiting alcohol use. Practicing safe sex. Taking low-dose aspirin every day. Taking vitamin and mineral supplements as recommended by your health care provider. What happens during an annual well check? The services and screenings done by your health care provider during your annual well check will depend on your age, overall health, lifestyle risk factors, and family history of disease. Counseling  Your health care provider may ask you questions about your: Alcohol use. Tobacco use. Drug use. Emotional well-being. Home and relationship  well-being. Sexual activity. Eating habits. History of falls. Memory and ability to understand (cognition). Work and work Statistician. Reproductive health. Screening  You may have the following tests or measurements: Height, weight, and BMI. Blood pressure. Lipid and cholesterol levels. These may be checked every 5 years, or more frequently if you are over 33 years old. Skin check. Lung cancer screening. You may have this screening every year starting at age 63 if you have a 30-pack-year history of smoking and currently smoke or have quit within the past 15 years. Fecal occult blood test (FOBT) of the stool. You may have this test every year starting at age 71. Flexible sigmoidoscopy or colonoscopy. You may have a sigmoidoscopy every 5 years or a colonoscopy every 10 years starting at age 64. Hepatitis C blood test. Hepatitis B blood test. Sexually transmitted disease (STD) testing. Diabetes screening. This is done by checking your blood sugar (glucose) after you have not eaten for a while (fasting). You may have this done every 1-3 years. Bone density scan. This is done to screen for osteoporosis. You may have this done starting at age 72. Mammogram. This may be done every 1-2 years. Talk to your health care provider about how often you should have regular mammograms. Talk with your health care provider about your test results, treatment options, and if necessary, the need for more tests. Vaccines  Your health care provider may recommend certain vaccines, such as: Influenza vaccine. This is recommended every year. Tetanus, diphtheria, and acellular pertussis (Tdap, Td) vaccine. You may need a Td booster every 10 years. Zoster vaccine. You may need this after age 75. Pneumococcal 13-valent conjugate (PCV13) vaccine. One dose is recommended after age 62. Pneumococcal polysaccharide (PPSV23) vaccine. One  dose is recommended after age 46. Talk to your health care provider about which  screenings and vaccines you need and how often you need them. This information is not intended to replace advice given to you by your health care provider. Make sure you discuss any questions you have with your health care provider. Document Released: 09/21/2015 Document Revised: 05/14/2016 Document Reviewed: 06/26/2015 Elsevier Interactive Patient Education  2017 [place] Prevention in the Home Falls can cause injuries. They can happen to people of all ages. There are many things you can do to make your home safe and to help prevent falls. What can I do on the outside of my home? Regularly fix the edges of walkways and driveways and fix any cracks. Remove anything that might make you trip as you walk through a door, such as a raised step or threshold. Trim any bushes or trees on the path to your home. Use bright outdoor lighting. Clear any walking paths of anything that might make someone trip, such as rocks or tools. Regularly check to see if handrails are loose or broken. Make sure that both sides of any steps have handrails. Any raised decks and porches should have guardrails on the edges. Have any leaves, snow, or ice cleared regularly. Use sand or salt on walking paths during winter. Clean up any spills in your garage right away. This includes oil or grease spills. What can I do in the bathroom? Use night lights. Install grab bars by the toilet and in the tub and shower. Do not use towel bars as grab bars. Use non-skid mats or decals in the tub or shower. If you need to sit down in the shower, use a plastic, non-slip stool. Keep the floor dry. Clean up any water that spills on the floor as soon as it happens. Remove soap buildup in the tub or shower regularly. Attach bath mats securely with double-sided non-slip rug tape. Do not have throw rugs and other things on the floor that can make you trip. What can I do in the bedroom? Use night lights. Make sure that you have a  light by your bed that is easy to reach. Do not use any sheets or blankets that are too big for your bed. They should not hang down onto the floor. Have a firm chair that has side arms. You can use this for support while you get dressed. Do not have throw rugs and other things on the floor that can make you trip. What can I do in the kitchen? Clean up any spills right away. Avoid walking on wet floors. Keep items that you use a lot in easy-to-reach places. If you need to reach something above you, use a strong step stool that has a grab bar. Keep electrical cords out of the way. Do not use floor polish or wax that makes floors slippery. If you must use wax, use non-skid floor wax. Do not have throw rugs and other things on the floor that can make you trip. What can I do with my stairs? Do not leave any items on the stairs. Make sure that there are handrails on both sides of the stairs and use them. Fix handrails that are broken or loose. Make sure that handrails are as long as the stairways. Check any carpeting to make sure that it is firmly attached to the stairs. Fix any carpet that is loose or worn. Avoid having throw rugs at the top or bottom of the  stairs. If you do have throw rugs, attach them to the floor with carpet tape. Make sure that you have a light switch at the top of the stairs and the bottom of the stairs. If you do not have them, ask someone to add them for you. What else can I do to help prevent falls? Wear shoes that: Do not have high heels. Have rubber bottoms. Are comfortable and fit you well. Are closed at the toe. Do not wear sandals. If you use a stepladder: Make sure that it is fully opened. Do not climb a closed stepladder. Make sure that both sides of the stepladder are locked into place. Ask someone to hold it for you, if possible. Clearly mark and make sure that you can see: Any grab bars or handrails. First and last steps. Where the edge of each step  is. Use tools that help you move around (mobility aids) if they are needed. These include: Canes. Walkers. Scooters. Crutches. Turn on the lights when you go into a dark area. Replace any light bulbs as soon as they burn out. Set up your furniture so you have a clear path. Avoid moving your furniture around. If any of your floors are uneven, fix them. If there are any pets around you, be aware of where they are. Review your medicines with your doctor. Some medicines can make you feel dizzy. This can increase your chance of falling. Ask your doctor what other things that you can do to help prevent falls. This information is not intended to replace advice given to you by your health care provider. Make sure you discuss any questions you have with your health care provider. Document Released: 06/21/2009 Document Revised: 01/31/2016 Document Reviewed: 09/29/2014 Elsevier Interactive Patient Education  2017 [employer].

## 2021-09-19 NOTE — Telephone Encounter (Signed)
Ms. Barbara Lopez, Barbara Lopez are scheduled for a virtual visit with your provider today.    Just as we do with appointments in the office, we must obtain your consent to participate.  Your consent will be active for this visit and any virtual visit you may have with one of our providers in the next 365 days.    If you have a MyChart account, I can also send a copy of this consent to you electronically.  All virtual visits are billed to your insurance company just like a traditional visit in the office.  As this is a virtual visit, video technology does not allow for your provider to perform a traditional examination.  This may limit your provider's ability to fully assess your condition.  If your provider identifies any concerns that need to be evaluated in person or the need to arrange testing such as labs, EKG, etc, we will make arrangements to do so.    Although advances in technology are sophisticated, we cannot ensure that it will always work on either your end or our end.  If the connection with a video visit is poor, we may have to switch to a telephone visit.  With either a video or telephone visit, we are not always able to ensure that we have a secure connection.   I need to obtain your verbal consent now.   Are you willing to proceed with your visit today?   Barbara Lopez has provided verbal consent on 09/19/2021 for a virtual visit (video or telephone).   Carroll Kinds, [facility] 09/19/2021  8:28 AM

## 2021-09-19 NOTE — Progress Notes (Signed)
Subjective:   Catlin KYLIE SIMMONDS is a 87 y.o. female who presents for Medicare Annual (Subsequent) preventive examination.  Review of Systems           Objective:    There were no vitals filed for this visit. There is no height or weight on file to calculate BMI.  Advanced Directives 09/19/2021 04/05/2021 10/05/2020  Does Patient Have a Medical Advance Directive? No No No  Would patient like information on creating a medical advance directive? - Yes (MAU/Ambulatory/Procedural Areas - Information given) Yes (MAU/Ambulatory/Procedural Areas - Information given)    Current Medications (verified) Outpatient Encounter Medications as of 09/19/2021  Medication Sig   albuterol (VENTOLIN HFA) 108 (90 Base) MCG/ACT inhaler Inhale 2 puffs into the lungs daily as needed for wheezing or shortness of breath. Emergency use   Cholecalciferol (VITAMIN D3 PO) Take 1 tablet by mouth in the morning and at bedtime.   ezetimibe (ZETIA) 10 MG tablet TAKE ONE TABLET BY MOUTH DAILY   hydrOXYzine (ATARAX/VISTARIL) 10 MG tablet Take 1 tablet (10 mg total) by mouth daily as needed.   levothyroxine (SYNTHROID) 88 MCG tablet Take 1 tablet (88 mcg total) by mouth daily.   meloxicam (MOBIC) 15 MG tablet TAKE ONE TABLET BY MOUTH DAILY   Multiple Vitamins-Minerals (PRESERVISION AREDS PO) Take by mouth in the morning and at bedtime.   pantoprazole (PROTONIX) 40 MG tablet Take 1 tablet (40 mg total) by mouth daily.   Tiotropium Bromide-Olodaterol (STIOLTO RESPIMAT) 2.5-2.5 MCG/ACT AERS Inhale 2 puffs into the lungs daily.   UNABLE TO FIND Med Name: Tree of Live fruit/Vegetables supplement once daily   venlafaxine (EFFEXOR) 75 MG tablet TAKE ONE TABLET BY MOUTH TWICE A DAY   No facility-administered encounter medications on file as of 09/19/2021.    Allergies (verified) Ace inhibitors, Iodinated contrast media, Iodine, and Other   History: Past Medical History:  Diagnosis Date   Anxiety    Asthma    Bell's palsy     Hearing loss bilateral   Hypothyroidism    Left wrist fracture    Macular degeneration, wet (HCC) nxiety   Non Hodgkin's lymphoma ([place])    Osteoarthritis    Past Surgical History:  Procedure Laterality Date   APPENDECTOMY     CATARACT EXTRACTION     CHOLECYSTECTOMY     TONSILLECTOMY     TUBAL LIGATION     Family History  Problem Relation Age of Onset   Throat cancer Mother    Heart attack Father    Lung cancer Sister    Dementia Sister    COPD Sister    Obesity Daughter    Pulmonary embolism Daughter    Breast cancer Daughter    Endometriosis Daughter    Hashimoto's thyroiditis Daughter    Social History   Socioeconomic History   Marital status: Widowed    Spouse name: Not on file   Number of children: Not on file   Years of education: Not on file   Highest education level: Not on file  Occupational History   Not on file  Tobacco Use   Smoking status: Never   Smokeless tobacco: Never  Vaping Use   Vaping Use: Never used  Substance and Sexual Activity   Alcohol use: Never   Drug use: Never   Sexual activity: Not Currently  Other Topics Concern   Not on file  Social History Narrative   Diet: Regular      Do you drink/ eat things with  caffeine? Coffee      Marital status:   Widowed                             What year were you married ? 1957      Do you live in a house, apartment,assistred living, condo, trailer, etc.)?  House      Is it one or more stories? 2 Stories      How many persons live in your home ? 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  House Work and Walk to [employer]                            Type & how often  Daily      ADVANCED DIRECTIVES (Please bring copies)      Do you have a living will? No      Do you have a DNR form? No                      If not, do you want to discuss one?  Yes      Do you have signed POA?HPOA forms?   No              If so, please bring to your appointment      FUNCTIONAL STATUS- To be completed by Spouse / child / Staff       Do you have difficulty bathing or dressing yourself ?  No      Do you have difficulty preparing food or eating ?  No      Do you have difficulty managing your mediation ?  No      Do you have difficulty managing your finances ?  No      Do you have difficulty affording your medication ?  No      Social Determinants of Radio broadcast assistant Strain: Not on file  Food Insecurity: Not on file  Transportation Needs: Not on file  Physical Activity: Not on file  Stress: Not on file  Social Connections: Not on file    Tobacco Counseling Counseling given: Not Answered   Clinical Intake:                 Diabetic?no         Activities of Daily Living No flowsheet data found.  Patient Care Team: Lauree Chandler, NP as PCP - General (Geriatric Medicine)  Indicate any recent Medical Services you may have received from other than [facility] providers in the past year (date may be approximate).     Assessment:   This is a routine wellness examination for Sandara.  Hearing/Vision screen Hearing Screening - Comments:: Patient wears hearing aids. Vision Screening - Comments:: Patient wears glasses. Patient has macular degeneration. Sees Dr. Posey Pronto (retina specialist). Can't recall last regular eye exam  Dietary issues and exercise activities discussed:     Goals Addressed   None    Depression Screen PHQ 2/9 Scores 09/19/2021 04/05/2021 09/14/2020 08/01/2020  PHQ - 2 Score 0 0 0 0    Fall Risk Fall Risk  09/19/2021 08/05/2021  04/05/2021 09/14/2020 08/01/2020  Falls in the past year? 0 0 0 0 1  Number falls in past yr: 0 0 0 0 0  Injury with Fall? 0 0 0 0 0  Risk for fall due to : No Fall Risks No Fall Risks No Fall Risks - -  Follow up Falls evaluation completed Falls evaluation completed  Falls evaluation completed - -    FALL RISK PREVENTION PERTAINING TO THE HOME:  Any stairs in or around the home? Yes  If so, are there any without handrails? No  Home free of loose throw rugs in walkways, pet beds, electrical cords, etc? Yes  Adequate lighting in your home to reduce risk of falls? Yes   ASSISTIVE DEVICES UTILIZED TO PREVENT FALLS:  Life alert? No  Use of a cane, walker or w/c? No  Grab bars in the bathroom? Yes  Shower chair or bench in shower? Yes  Elevated toilet seat or a handicapped toilet? Yes   TIMED UP AND GO:  Was the test performed? No .   Cognitive Function:     6CIT Screen 09/19/2021 09/14/2020  What Year? 0 points 0 points  What month? 0 points 0 points  What time? 0 points 0 points  Count back from 20 0 points 0 points  Months in reverse 4 points 4 points  Repeat phrase 2 points 6 points  Total Score 6 10    Immunizations Immunization History  Administered Date(s) Administered   Fluad Quad(high Dose 65+) 08/01/2020, 06/17/2021   Influenza-Unspecified 06/17/2019   Moderna SARS-COV2 Booster Vaccination 09/11/2020   Moderna Sars-Covid-2 Vaccination 11/04/2019, 12/02/2019   Pfizer Covid-19 Vaccine Bivalent Booster 60yrs & up 06/17/2021   Pneumococcal Conjugate-13 07/18/2016   Pneumococcal Polysaccharide-23 04/08/2002   Tdap 12/26/2016   Zoster, Live 07/18/2016    TDAP status: Up to date  Flu Vaccine status: Up to date  Pneumococcal vaccine status: Up to date  Covid-19 vaccine status: Information provided on how to obtain vaccines.   Qualifies for Shingles Vaccine? Yes   Zostavax completed Yes   Shingrix Completed?: No.    Education has been provided regarding the importance of this vaccine. Patient has been advised to call insurance company to determine out of pocket expense if they have not yet received this vaccine. Advised may also receive vaccine at local pharmacy or Health Dept. Verbalized acceptance and  understanding.  Screening Tests Health Maintenance  Topic Date Due   URINE MICROALBUMIN  Never done   Zoster Vaccines- Shingrix (1 of 2) Never done   TETANUS/TDAP  12/27/2026   Pneumonia Vaccine 8+ Years old  Completed   INFLUENZA VACCINE  Completed   DEXA SCAN  Completed   COVID-19 Vaccine  Completed   HPV VACCINES  Aged Out    Health Maintenance  Health Maintenance Due  Topic Date Due   URINE MICROALBUMIN  Never done   Zoster Vaccines- Shingrix (1 of 2) Never done    Colorectal cancer screening: No longer required.   Mammogram status: No longer required due to aged out.  Bone Density status: Completed 03/12/21. Results reflect: Bone density results: OSTEOPENIA. Repeat every 2 years.  Lung Cancer Screening: (Low Dose CT Chest recommended if Age 27-80 years, 30 pack-year currently smoking OR have quit w/in 15years.) does not qualify.   Lung Cancer Screening Referral: na  Additional Screening:  Hepatitis C Screening: does not qualify; Completed na  Vision Screening: Recommended annual ophthalmology exams for early detection of glaucoma and other disorders of  the eye. Is the patient up to date with their annual eye exam?  Yes  Who is the provider or what is the name of the office in which the patient attends annual eye exams? [facility] If pt is not established with a provider, would they like to be referred to a provider to establish care? No .   Dental Screening: Recommended annual dental exams for proper oral hygiene  Community Resource Referral / Chronic Care Management: CRR required this visit?  No   CCM required this visit?  No      Plan:     I have personally reviewed and noted the following in the patients chart:   Medical and social history Use of alcohol, tobacco or illicit drugs  Current medications and supplements including opioid prescriptions.  Functional ability and status Nutritional status Physical activity Advanced directives List of other  physicians Hospitalizations, surgeries, and ER visits in previous 12 months Vitals Screenings to include cognitive, depression, and falls Referrals and appointments  In addition, I have reviewed and discussed with patient certain preventive protocols, quality metrics, and best practice recommendations. A written personalized care plan for preventive services as well as general preventive health recommendations were provided to patient.     Lauree Chandler, NP   09/19/2021    Virtual Visit via Telephone Note  I connected withNAME@ on 09/19/21 at  8:00 AM EST by telephone and verified that I am speaking with the correct person using two identifiers.  Location: Patient: home  Provider: [facility]    I discussed the limitations, risks, security and privacy concerns of performing an evaluation and management service by telephone and the availability of in person appointments. I also discussed with the patient that there may be a patient responsible charge related to this service. The patient expressed understanding and agreed to proceed.   I discussed the assessment and treatment plan with the patient. The patient was provided an opportunity to ask questions and all were answered. The patient agreed with the plan and demonstrated an understanding of the instructions.   The patient was advised to call back or seek an in-person evaluation if the symptoms worsen or if the condition fails to improve as anticipated.  I provided 18 minutes of non-face-to-face time during this encounter.  Carlos American. Harle Battiest Avs printed and mailed

## 2021-10-07 ENCOUNTER — Other Ambulatory Visit: Payer: Self-pay | Admitting: Nurse Practitioner

## 2021-10-07 ENCOUNTER — Other Ambulatory Visit: Payer: Self-pay | Admitting: Orthopaedic Surgery

## 2021-10-07 DIAGNOSIS — E039 Hypothyroidism, unspecified: Secondary | ICD-10-CM

## 2021-10-07 NOTE — Telephone Encounter (Signed)
Patient has request refill on medications "Zetia, Hydroxizine, Synthroid, and Effexor". Medications have Warnings. Medication pend and sent to PCP Dewaine Oats Carlos American, NP for approval.

## 2021-10-14 ENCOUNTER — Telehealth: Payer: Self-pay | Admitting: *Deleted

## 2021-10-14 NOTE — Telephone Encounter (Signed)
Received fax from [facility] [phone] Fax: [phone] for Prior Authorization for patient's Hydroxyzine 10mg .   Filled out form and placed in [place] folder to review, fill out and sign. To be faxed back to: [phone] once completed.   Member ID: [id number]

## 2021-12-05 ENCOUNTER — Other Ambulatory Visit: Payer: Self-pay | Admitting: Nurse Practitioner

## 2021-12-05 DIAGNOSIS — K219 Gastro-esophageal reflux disease without esophagitis: Secondary | ICD-10-CM

## 2022-01-06 ENCOUNTER — Other Ambulatory Visit: Payer: Self-pay | Admitting: Orthopaedic Surgery

## 2022-02-07 ENCOUNTER — Encounter: Payer: Self-pay | Admitting: Nurse Practitioner

## 2022-02-07 ENCOUNTER — Ambulatory Visit (INDEPENDENT_AMBULATORY_CARE_PROVIDER_SITE_OTHER): Payer: Medicare Other | Admitting: Nurse Practitioner

## 2022-02-07 VITALS — BP 132/78 | HR 87 | Temp 97.3°F | Ht 62.0 in | Wt 153.0 lb

## 2022-02-07 DIAGNOSIS — R197 Diarrhea, unspecified: Secondary | ICD-10-CM

## 2022-02-07 DIAGNOSIS — I10 Essential (primary) hypertension: Secondary | ICD-10-CM

## 2022-02-07 DIAGNOSIS — K219 Gastro-esophageal reflux disease without esophagitis: Secondary | ICD-10-CM | POA: Diagnosis not present

## 2022-02-07 DIAGNOSIS — M8580 Other specified disorders of bone density and structure, unspecified site: Secondary | ICD-10-CM

## 2022-02-07 DIAGNOSIS — E039 Hypothyroidism, unspecified: Secondary | ICD-10-CM

## 2022-02-07 DIAGNOSIS — M170 Bilateral primary osteoarthritis of knee: Secondary | ICD-10-CM

## 2022-02-07 DIAGNOSIS — L609 Nail disorder, unspecified: Secondary | ICD-10-CM

## 2022-02-07 DIAGNOSIS — E785 Hyperlipidemia, unspecified: Secondary | ICD-10-CM

## 2022-02-07 DIAGNOSIS — F419 Anxiety disorder, unspecified: Secondary | ICD-10-CM

## 2022-02-07 NOTE — Patient Instructions (Addendum)
Increase vit D to 2000 units daily   Increase water intake.

## 2022-02-07 NOTE — Progress Notes (Signed)
Careteam: Patient Care Team: Lauree Chandler, NP as PCP - General (Geriatric Medicine)  PLACE OF SERVICE:  [place] Directive information Does Patient Have a Medical Advance Directive?: Yes, Type of Advance Directive: Out of facility DNR (pink MOST or yellow form), Pre-existing out of facility DNR order (yellow form or pink MOST form): Pink MOST form placed in chart (order not valid for inpatient use), Does patient want to make changes to medical advance directive?: No - Patient declined  Allergies  Allergen Reactions   Ace Inhibitors Other (See Comments)   Iodinated Contrast Media    Iodine     Iodine Contrast   Other Rash    Chief Complaint  Patient presents with   Medical Management of Chronic Issues    6 month follow-up. Discuss need for protonix. Discuss dizziness when standing. Patient takes all medications in the am, with the exception of thyroid medication. Here with daughter Alfonse Alpers     HPI: Patient is a 87 y.o. female for routine follow up.   Off all blood pressure medication.   Drinks caffeine coffee all day- diluted.   GERD- started protonix but still gets choked at night/coughing.   Osteoarthritis of both knees- continues on mobic, also using tylenol.   She is having leakage of stools. Feels like her body is cleaning her out.  Will have small stools then have diarrhea episodes.   Anxiety- controlled.    Review of Systems:  Review of Systems  Constitutional:  Negative for chills, fever and weight loss.  HENT:  Negative for tinnitus.   Respiratory:  Negative for cough, sputum production and shortness of breath.   Cardiovascular:  Negative for chest pain, palpitations and leg swelling.  Gastrointestinal:  Positive for diarrhea. Negative for abdominal pain, constipation and heartburn.  Genitourinary:  Negative for dysuria, frequency and urgency.  Musculoskeletal:  Positive for joint pain. Negative for back pain, falls and myalgias.   Skin: Negative.   Neurological:  Negative for dizziness and headaches.  Psychiatric/Behavioral:  Negative for depression and memory loss. The patient does not have insomnia.    Past Medical History:  Diagnosis Date   Anxiety    Asthma    Bell's palsy    Hearing loss bilateral   Hypothyroidism    Left wrist fracture    Macular degeneration, wet (HCC) nxiety   Non Hodgkin's lymphoma ([place])    Osteoarthritis    Past Surgical History:  Procedure Laterality Date   APPENDECTOMY     CATARACT EXTRACTION     CHOLECYSTECTOMY     TONSILLECTOMY     TUBAL LIGATION     Social History:   reports that she has never smoked. She has never used smokeless tobacco. She reports that she does not drink alcohol and does not use drugs.  Family History  Problem Relation Age of Onset   Throat cancer Mother    Heart attack Father    Lung cancer Sister    Dementia Sister    COPD Sister    Obesity Daughter    Pulmonary embolism Daughter    Breast cancer Daughter    Endometriosis Daughter    Hashimoto's thyroiditis Daughter     Medications: Patient's Medications  New Prescriptions   No medications on file  Previous Medications   ALBUTEROL (VENTOLIN HFA) 108 (90 BASE) MCG/ACT INHALER    Inhale 2 puffs into the lungs daily as needed for wheezing or shortness of breath. Emergency use   BIOTIN  MAXIMUM PO    Take 1 tablet by mouth daily.   CALCIUM CARB-CHOLECALCIFEROL (CALCIUM/VITAMIN D PO)    Take 2 tablets by mouth daily.   CHOLECALCIFEROL (VITAMIN D3 PO)    Take 1 tablet by mouth in the morning and at bedtime.   EZETIMIBE (ZETIA) 10 MG TABLET    TAKE ONE TABLET BY MOUTH DAILY   HYDROXYZINE (ATARAX) 10 MG TABLET    TAKE ONE TABLET BY MOUTH DAILY AS NEEDED   LEVOTHYROXINE (SYNTHROID) 88 MCG TABLET    TAKE ONE TABLET BY MOUTH DAILY   MELOXICAM (MOBIC) 15 MG TABLET    TAKE ONE TABLET BY MOUTH DAILY   MULTIPLE VITAMINS-MINERALS (PRESERVISION AREDS PO)    Take by mouth in the morning and at bedtime.    NUTRITIONAL SUPPLEMENTS (FRUIT & VEGETABLE DAILY) CAPS    Take by mouth. 2 fruit and 2 Veggie supplements daily (Balance of Nature)   PANTOPRAZOLE (PROTONIX) 40 MG TABLET    TAKE ONE TABLET BY MOUTH DAILY   TIOTROPIUM BROMIDE-OLODATEROL (STIOLTO RESPIMAT) 2.5-2.5 MCG/ACT AERS    Inhale 2 puffs into the lungs daily.   VENLAFAXINE (EFFEXOR) 75 MG TABLET    TAKE ONE TABLET BY MOUTH TWICE A DAY  Modified Medications   No medications on file  Discontinued Medications   UNABLE TO FIND    Med Name: [employer] fruit/Vegetables supplement once daily    Physical Exam:  Vitals:   02/07/22 0920  BP: 132/78  Pulse: 87  Temp: (!) 97.3 F (36.3 C)  TempSrc: Temporal  SpO2: 98%  Weight: 153 lb (69.4 kg)  Height: _0  (1.575 m)   Body mass index is 27.98 kg/m. Wt Readings from Last 3 Encounters:  02/07/22 153 lb (69.4 kg)  08/05/21 150 lb 3.2 oz (68.1 kg)  06/24/21 146 lb 3.2 oz (66.3 kg)    Physical Exam Constitutional:      General: She is not in acute distress.    Appearance: She is well-developed. She is not diaphoretic.  HENT:     Head: Normocephalic and atraumatic.     Mouth/Throat:     Pharynx: No oropharyngeal exudate.  Eyes:     Conjunctiva/sclera: Conjunctivae normal.     Pupils: Pupils are equal, round, and reactive to light.  Cardiovascular:     Rate and Rhythm: Normal rate and regular rhythm.     Heart sounds: Normal heart sounds.  Pulmonary:     Effort: Pulmonary effort is normal.     Breath sounds: Normal breath sounds.  Abdominal:     General: Bowel sounds are normal.     Palpations: Abdomen is soft.  Musculoskeletal:     Cervical back: Normal range of motion and neck supple.     Right lower leg: No edema.     Left lower leg: No edema.  Skin:    General: Skin is warm and dry.  Neurological:     Mental Status: She is alert and oriented to person, place, and time.  Psychiatric:        Mood and Affect: Mood normal.    Labs reviewed: Basic Metabolic  Panel: Recent Labs    04/01/21 0835 05/10/21 0854 08/05/21 0920  NA 140  --  141  K 4.5  --  4.3  CL 106  --  107  CO2 27  --  28  GLUCOSE 117*  --  103*  BUN 14  --  15  CREATININE 0.75  --  0.66  CALCIUM 9.2  --  8.9  TSH 0.38* 0.49  --    Liver Function Tests: Recent Labs    04/01/21 0835 08/05/21 0920  AST 15 14  ALT 15 13  BILITOT 0.5 0.6  PROT 6.4 6.1   No results for input(s): LIPASE, AMYLASE in the last 8760 hours. No results for input(s): AMMONIA in the last 8760 hours. CBC: Recent Labs    04/01/21 0835  WBC 6.9  NEUTROABS 3,657  HGB 14.7  HCT 45.3*  MCV 92.3  PLT 280   Lipid Panel: Recent Labs    04/01/21 0835  CHOL 174  HDL 56  LDLCALC 101*  TRIG 81  CHOLHDL 3.1   TSH: Recent Labs    04/01/21 0835 05/10/21 0854  TSH 0.38* 0.49   A1C: Lab Results  Component Value Date   HGBA1C 5.9 (H) 05/10/2021     Assessment/Plan 1. Primary hypertension -Blood pressure well controlled Continue current medications Recheck metabolic panel - CMP with eGFR([facility]) - CBC with Differential/Platelet  2. Gastroesophageal reflux disease without esophagitis -stable, will continue protonix.   3. Primary osteoarthritis of both knees Stable on mobic and tylenol, will follow up labs to check renal function.  Continues to follow up with ortho.   4. Acquired hypothyroidism -tsh stable on synthroid 88 mcg  5. Anxiety -stable on effexor  6. Hyperlipidemia LDL goal <100 -continues on zetia - CMP with eGFR [facility]) - Lipid panel  7. Nail abnormalities -likley due to dietary deficiencies.  - CMP with eGFR([facility]) - CBC with Differential/Platelet - B12 and Folate Panel  8. Osteopenia, unspecified location -Recommended to take calcium 600 mg twice daily with Vitamin D 2000 units daily and weight bearing activity 30 mins/5 days a week  9. Diarrhea, unspecified type -constipation followed by diarrhea, to start fiber supplement to help bulk stools.  Increase water intake -notify if symptoms worsen or fail to improve  Return in about 6 months (around 08/09/2022) for routine follow up. Carlos American. [employer], [facility] [phone]

## 2022-02-08 LAB — B12 AND FOLATE PANEL
Folate: 5.4 ng/mL — ABNORMAL LOW
Vitamin B-12: 369 pg/mL (ref 200–1100)

## 2022-02-08 LAB — COMPLETE METABOLIC PANEL WITH GFR
AG Ratio: 1.6 (calc) (ref 1.0–2.5)
ALT: 11 U/L (ref 6–29)
AST: 15 U/L (ref 10–35)
Albumin: 4 g/dL (ref 3.6–5.1)
Alkaline phosphatase (APISO): 73 U/L (ref 37–153)
BUN: 11 mg/dL (ref 7–25)
CO2: 25 mmol/L (ref 20–32)
Calcium: 9.2 mg/dL (ref 8.6–10.4)
Chloride: 105 mmol/L (ref 98–110)
Creat: 0.79 mg/dL (ref 0.60–0.95)
Globulin: 2.5 g/dL (calc) (ref 1.9–3.7)
Glucose, Bld: 91 mg/dL (ref 65–99)
Potassium: 4.5 mmol/L (ref 3.5–5.3)
Sodium: 140 mmol/L (ref 135–146)
Total Bilirubin: 0.6 mg/dL (ref 0.2–1.2)
Total Protein: 6.5 g/dL (ref 6.1–8.1)
eGFR: 74 mL/min/{1.73_m2} (ref >=60)

## 2022-02-08 LAB — CBC WITH DIFFERENTIAL/PLATELET
Absolute Monocytes: 414 cells/uL (ref 200–950)
Basophils Absolute: 28 cells/uL (ref 0–200)
Basophils Relative: 0.5 %
Eosinophils Absolute: 151 cells/uL (ref 15–500)
Eosinophils Relative: 2.7 %
HCT: 41.3 % (ref 35.0–45.0)
Hemoglobin: 13.7 g/dL (ref 11.7–15.5)
Lymphs Abs: 2234 cells/uL (ref 850–3900)
MCH: 30.6 pg (ref 27.0–33.0)
MCHC: 33.2 g/dL (ref 32.0–36.0)
MCV: 92.2 fL (ref 80.0–100.0)
MPV: 9.6 fL (ref 7.5–12.5)
Monocytes Relative: 7.4 %
Neutro Abs: 2772 cells/uL (ref 1500–7800)
Neutrophils Relative %: 49.5 %
Platelets: 279 10*3/uL (ref 140–400)
RBC: 4.48 10*6/uL (ref 3.80–5.10)
RDW: 14.5 % (ref 11.0–15.0)
Total Lymphocyte: 39.9 %
WBC: 5.6 10*3/uL (ref 3.8–10.8)

## 2022-02-08 LAB — LIPID PANEL
Cholesterol: 182 mg/dL (ref <200)
HDL: 57 mg/dL (ref >=50)
LDL Cholesterol (Calc): 102 mg/dL (calc) — ABNORMAL HIGH
Non-HDL Cholesterol (Calc): 125 mg/dL (calc) (ref <130)
Total CHOL/HDL Ratio: 3.2 (calc) (ref <5.0)
Triglycerides: 125 mg/dL (ref <150)

## 2022-03-07 ENCOUNTER — Other Ambulatory Visit: Payer: Self-pay | Admitting: Nurse Practitioner

## 2022-03-07 ENCOUNTER — Other Ambulatory Visit: Payer: Self-pay | Admitting: Orthopaedic Surgery

## 2022-05-04 ENCOUNTER — Encounter: Payer: Self-pay | Admitting: Nurse Practitioner

## 2022-05-04 ENCOUNTER — Other Ambulatory Visit: Payer: Self-pay | Admitting: Nurse Practitioner

## 2022-05-05 MED ORDER — STIOLTO RESPIMAT 2.5-2.5 MCG/ACT IN AERS: 2.0000 | INHALATION_SPRAY | Freq: Every day | RESPIRATORY_TRACT | 3 refills | Status: DC

## 2022-05-05 NOTE — Telephone Encounter (Signed)
This encounter was created in error - please disregard.

## 2022-05-06 ENCOUNTER — Encounter (HOSPITAL_COMMUNITY): Payer: Self-pay | Admitting: Interventional Cardiology

## 2022-05-13 ENCOUNTER — Telehealth (HOSPITAL_COMMUNITY): Payer: Self-pay | Admitting: Interventional Cardiology

## 2022-05-13 NOTE — Telephone Encounter (Signed)
Just an FYI. We have made several attempts to contact this patient including sending a letter to schedule or reschedule their echocardiogram. We will be removing the patient from the echo WQ.   MAILED LETTER [facility]  05/06/22 [facility] to schedule @ 1:21/LBW  04/07/22 [facility] to schedule for October /LBW 3:47     Thank you

## 2022-06-05 ENCOUNTER — Other Ambulatory Visit: Payer: Self-pay | Admitting: Nurse Practitioner

## 2022-06-05 DIAGNOSIS — K219 Gastro-esophageal reflux disease without esophagitis: Secondary | ICD-10-CM

## 2022-08-15 ENCOUNTER — Encounter: Payer: Self-pay | Admitting: Nurse Practitioner

## 2022-08-15 ENCOUNTER — Ambulatory Visit (INDEPENDENT_AMBULATORY_CARE_PROVIDER_SITE_OTHER): Payer: Medicare Other | Admitting: Nurse Practitioner

## 2022-08-15 VITALS — BP 140/82 | HR 89 | Temp 98.7°F | Ht 62.0 in | Wt 155.2 lb

## 2022-08-15 DIAGNOSIS — E785 Hyperlipidemia, unspecified: Secondary | ICD-10-CM

## 2022-08-15 DIAGNOSIS — I10 Essential (primary) hypertension: Secondary | ICD-10-CM | POA: Diagnosis not present

## 2022-08-15 DIAGNOSIS — K219 Gastro-esophageal reflux disease without esophagitis: Secondary | ICD-10-CM | POA: Diagnosis not present

## 2022-08-15 DIAGNOSIS — R0683 Snoring: Secondary | ICD-10-CM

## 2022-08-15 DIAGNOSIS — M170 Bilateral primary osteoarthritis of knee: Secondary | ICD-10-CM | POA: Diagnosis not present

## 2022-08-15 DIAGNOSIS — F419 Anxiety disorder, unspecified: Secondary | ICD-10-CM

## 2022-08-15 DIAGNOSIS — R1310 Dysphagia, unspecified: Secondary | ICD-10-CM

## 2022-08-15 DIAGNOSIS — E039 Hypothyroidism, unspecified: Secondary | ICD-10-CM

## 2022-08-15 DIAGNOSIS — R197 Diarrhea, unspecified: Secondary | ICD-10-CM

## 2022-08-15 DIAGNOSIS — R739 Hyperglycemia, unspecified: Secondary | ICD-10-CM

## 2022-08-15 NOTE — Progress Notes (Signed)
Careteam: Patient Care Team: Lauree Chandler, NP as PCP - General (Geriatric Medicine)  PLACE OF SERVICE:  [place]  Advanced Directive information    Allergies  Allergen Reactions   Ace Inhibitors Other (See Comments)   Iodinated Contrast Media    Iodine     Iodine Contrast   Other Rash    Chief Complaint  Patient presents with   Follow-up   Hypertension     HPI: Patient is a 87 y.o. female for routine follow up.  She has had flu shot and COVID booster at [employer] she is doing well- no concerns from patient but daughter reports left knee is bothering her most days.   Continues to have diarrhea frequently and has such an urgency does not make it to the bathroom.  Question if it is a food that contributes.  Also worse when she is around people. Does not like to be around ppl.  She has a lot of anxiety going out. Uses hydroxyzine when she has to get shots.  She now is having to get shots in both eyes to help preserved her vision due to macular degeneration.   Htn- blood pressure well controlled at home.   Daughter reports she gets choked up occasionally when she is eating.   Continues to snore and gasp for air.    Review of Systems:  ROS***  Past Medical History:  Diagnosis Date   Anxiety    Asthma    Bell's palsy    Hearing loss bilateral   Hypothyroidism    Left wrist fracture    Macular degeneration, wet (HCC) nxiety   Non Hodgkin's lymphoma ([place])    Osteoarthritis    Past Surgical History:  Procedure Laterality Date   APPENDECTOMY     CATARACT EXTRACTION     CHOLECYSTECTOMY     TONSILLECTOMY     TUBAL LIGATION     Social History:   reports that she has never smoked. She has never used smokeless tobacco. She reports that she does not drink alcohol and does not use drugs.  Family History  Problem Relation Age of Onset   Throat cancer Mother    Heart attack Father    Lung cancer Sister    Dementia Sister    COPD Sister     Obesity Daughter    Pulmonary embolism Daughter    Breast cancer Daughter    Endometriosis Daughter    Hashimoto's thyroiditis Daughter     Medications: Patient's Medications  New Prescriptions   No medications on file  Previous Medications   ALBUTEROL (VENTOLIN HFA) 108 (90 BASE) MCG/ACT INHALER    INHALE TWO PUFFS BY MOUTH DAILY AS NEEDED FOR WHEEZING OR SHORTNESS OF BREATH **EMERGENCY USE**   BIOTIN MAXIMUM PO    Take 1 tablet by mouth daily.   CALCIUM CARB-CHOLECALCIFEROL (CALCIUM/VITAMIN D PO)    Take 2 tablets by mouth daily.   CHOLECALCIFEROL (VITAMIN D3 PO)    Take 1 tablet by mouth in the morning and at bedtime.   EZETIMIBE (ZETIA) 10 MG TABLET    TAKE ONE TABLET BY MOUTH DAILY   HYDROXYZINE (ATARAX) 10 MG TABLET    TAKE ONE TABLET BY MOUTH DAILY AS NEEDED   LEVOTHYROXINE (SYNTHROID) 88 MCG TABLET    TAKE ONE TABLET BY MOUTH DAILY   MELOXICAM (MOBIC) 15 MG TABLET    TAKE ONE TABLET BY MOUTH DAILY   MULTIPLE VITAMINS-MINERALS (PRESERVISION AREDS PO)  Take by mouth in the morning and at bedtime.   MULTIPLE VITAMINS-MINERALS (PRESERVISION AREDS PO)    Take by mouth.   NUTRITIONAL SUPPLEMENTS (FRUIT & VEGETABLE DAILY) CAPS    Take by mouth. 2 fruit and 2 Veggie supplements daily (Balance of Nature)   PANTOPRAZOLE (PROTONIX) 40 MG TABLET    TAKE ONE TABLET BY MOUTH DAILY   TIOTROPIUM BROMIDE-OLODATEROL (STIOLTO RESPIMAT) 2.5-2.5 MCG/ACT AERS    Inhale 2 puffs into the lungs daily.   VENLAFAXINE (EFFEXOR) 75 MG TABLET    TAKE ONE TABLET BY MOUTH TWICE A DAY  Modified Medications   No medications on file  Discontinued Medications   No medications on file    Physical Exam:  Vitals:   08/15/22 1000 08/15/22 1004  BP: (!) 142/84 (!) 140/82  Pulse: 89   Temp: 98.7 F (37.1 C)   SpO2: 98%   Weight: 155 lb 3.2 oz (70.4 kg)   Height: '5\' 2"'$  (1.575 m)    Body mass index is 28.39 kg/m. Wt Readings from Last 3 Encounters:  08/15/22 155 lb 3.2 oz (70.4 kg)  02/07/22 153 lb  (69.4 kg)  08/05/21 150 lb 3.2 oz (68.1 kg)    Physical Exam***  Labs reviewed: Basic Metabolic Panel: Recent Labs    02/07/22 1028  NA 140  K 4.5  CL 105  CO2 25  GLUCOSE 91  BUN 11  CREATININE 0.79  CALCIUM 9.2   Liver Function Tests: Recent Labs    02/07/22 1028  AST 15  ALT 11  BILITOT 0.6  PROT 6.5   No results for input(s): "LIPASE", "AMYLASE" in the last 8760 hours. No results for input(s): "AMMONIA" in the last 8760 hours. CBC: Recent Labs    02/07/22 1028  WBC 5.6  NEUTROABS 2,772  HGB 13.7  HCT 41.3  MCV 92.2  PLT 279   Lipid Panel: Recent Labs    02/07/22 1028  CHOL 182  HDL 57  LDLCALC 102*  TRIG 125  CHOLHDL 3.2   TSH: No results for input(s): "TSH" in the last 8760 hours. A1C: Lab Results  Component Value Date   HGBA1C 5.9 (H) 05/10/2021     Assessment/Plan There are no diagnoses linked to this encounter.  No follow-ups on file.: *** Aunika Kirsten K. Kennedy, [facility] [phone]

## 2022-08-16 ENCOUNTER — Encounter: Payer: Self-pay | Admitting: Nurse Practitioner

## 2022-08-16 LAB — COMPLETE METABOLIC PANEL WITH GFR
AG Ratio: 1.8 (calc) (ref 1.0–2.5)
ALT: 11 U/L (ref 6–29)
AST: 15 U/L (ref 10–35)
Albumin: 4.4 g/dL (ref 3.6–5.1)
Alkaline phosphatase (APISO): 77 U/L (ref 37–153)
BUN: 19 mg/dL (ref 7–25)
CO2: 27 mmol/L (ref 20–32)
Calcium: 9.4 mg/dL (ref 8.6–10.4)
Chloride: 103 mmol/L (ref 98–110)
Creat: 0.82 mg/dL (ref 0.60–0.95)
Globulin: 2.4 g/dL (calc) (ref 1.9–3.7)
Glucose, Bld: 104 mg/dL — ABNORMAL HIGH (ref 65–99)
Potassium: 4.7 mmol/L (ref 3.5–5.3)
Sodium: 138 mmol/L (ref 135–146)
Total Bilirubin: 0.5 mg/dL (ref 0.2–1.2)
Total Protein: 6.8 g/dL (ref 6.1–8.1)
eGFR: 70 mL/min/{1.73_m2} (ref >=60)

## 2022-08-16 LAB — LIPID PANEL
Cholesterol: 182 mg/dL (ref <200)
HDL: 67 mg/dL (ref >=50)
LDL Cholesterol (Calc): 99 mg/dL (calc)
Non-HDL Cholesterol (Calc): 115 mg/dL (calc) (ref <130)
Total CHOL/HDL Ratio: 2.7 (calc) (ref <5.0)
Triglycerides: 73 mg/dL (ref <150)

## 2022-08-16 LAB — CBC WITH DIFFERENTIAL/PLATELET
Absolute Monocytes: 482 cells/uL (ref 200–950)
Basophils Absolute: 53 cells/uL (ref 0–200)
Basophils Relative: 0.8 %
Eosinophils Absolute: 158 cells/uL (ref 15–500)
Eosinophils Relative: 2.4 %
HCT: 43.1 % (ref 35.0–45.0)
Hemoglobin: 14.2 g/dL (ref 11.7–15.5)
Lymphs Abs: 2112 cells/uL (ref 850–3900)
MCH: 30.3 pg (ref 27.0–33.0)
MCHC: 32.9 g/dL (ref 32.0–36.0)
MCV: 91.9 fL (ref 80.0–100.0)
MPV: 9.5 fL (ref 7.5–12.5)
Monocytes Relative: 7.3 %
Neutro Abs: 3795 cells/uL (ref 1500–7800)
Neutrophils Relative %: 57.5 %
Platelets: 302 10*3/uL (ref 140–400)
RBC: 4.69 10*6/uL (ref 3.80–5.10)
RDW: 14 % (ref 11.0–15.0)
Total Lymphocyte: 32 %
WBC: 6.6 10*3/uL (ref 3.8–10.8)

## 2022-08-16 LAB — HEMOGLOBIN A1C
Hgb A1c MFr Bld: 6.4 % of total Hgb — ABNORMAL HIGH (ref <5.7)
Mean Plasma Glucose: 137 mg/dL
eAG (mmol/L): 7.6 mmol/L

## 2022-08-16 LAB — TSH: TSH: 0.26 mIU/L — ABNORMAL LOW (ref 0.40–4.50)

## 2022-08-18 ENCOUNTER — Other Ambulatory Visit: Payer: Self-pay

## 2022-08-18 DIAGNOSIS — E039 Hypothyroidism, unspecified: Secondary | ICD-10-CM

## 2022-08-18 MED ORDER — LEVOTHYROXINE SODIUM 75 MCG PO TABS: 75.0000 ug | ORAL_TABLET | Freq: Every day | ORAL | 1 refills | Status: DC

## 2022-09-02 ENCOUNTER — Other Ambulatory Visit: Payer: Self-pay | Admitting: Nurse Practitioner

## 2022-09-02 ENCOUNTER — Other Ambulatory Visit: Payer: Self-pay | Admitting: Orthopaedic Surgery

## 2022-09-23 ENCOUNTER — Telehealth: Payer: Self-pay

## 2022-09-23 ENCOUNTER — Encounter: Payer: Self-pay | Admitting: Nurse Practitioner

## 2022-09-23 ENCOUNTER — Ambulatory Visit (INDEPENDENT_AMBULATORY_CARE_PROVIDER_SITE_OTHER): Payer: Medicare Other | Admitting: Nurse Practitioner

## 2022-09-23 DIAGNOSIS — Z0000 Encounter for general adult medical examination without abnormal findings: Secondary | ICD-10-CM

## 2022-09-23 NOTE — Patient Instructions (Signed)
Barbara Lopez , Thank you for taking time to come for your Medicare Wellness Visit. I appreciate your ongoing commitment to your health goals. Please review the following plan we discussed and let me know if I can assist you in the future.   Screening recommendations/referrals: Colonoscopy aged out Mammogram aged out Bone Density  up to date- due JULY 2024 Recommended yearly ophthalmology/optometry visit for glaucoma screening and checkup Recommended yearly dental visit for hygiene and checkup  Vaccinations: Influenza vaccine- due annually in September/October Pneumococcal vaccine up to date Tdap vaccine up to date Shingles vaccine -DUE- recommend to get at your local pharmacy    Advanced directives: on file.   Conditions/risks identified: advanced age.   Next appointment: yearly   Preventive Care 86 Years and Older, Female Preventive care refers to lifestyle choices and visits with your health care provider that can promote health and wellness. What does preventive care include? A yearly physical exam. This is also called an annual well check. Dental exams once or twice a year. Routine eye exams. Ask your health care provider how often you should have your eyes checked. Personal lifestyle choices, including: Daily care of your teeth and gums. Regular physical activity. Eating a healthy diet. Avoiding tobacco and drug use. Limiting alcohol use. Practicing safe sex. Taking low-dose aspirin every day. Taking vitamin and mineral supplements as recommended by your health care provider. What happens during an annual well check? The services and screenings done by your health care provider during your annual well check will depend on your age, overall health, lifestyle risk factors, and family history of disease. Counseling  Your health care provider may ask you questions about your: Alcohol use. Tobacco use. Drug use. Emotional well-being. Home and relationship  well-being. Sexual activity. Eating habits. History of falls. Memory and ability to understand (cognition). Work and work Statistician. Reproductive health. Screening  You may have the following tests or measurements: Height, weight, and BMI. Blood pressure. Lipid and cholesterol levels. These may be checked every 5 years, or more frequently if you are over 82 years old. Skin check. Lung cancer screening. You may have this screening every year starting at age 44 if you have a 30-pack-year history of smoking and currently smoke or have quit within the past 15 years. Fecal occult blood test (FOBT) of the stool. You may have this test every year starting at age 69. Flexible sigmoidoscopy or colonoscopy. You may have a sigmoidoscopy every 5 years or a colonoscopy every 10 years starting at age 22. Hepatitis C blood test. Hepatitis B blood test. Sexually transmitted disease (STD) testing. Diabetes screening. This is done by checking your blood sugar (glucose) after you have not eaten for a while (fasting). You may have this done every 1-3 years. Bone density scan. This is done to screen for osteoporosis. You may have this done starting at age 39. Mammogram. This may be done every 1-2 years. Talk to your health care provider about how often you should have regular mammograms. Talk with your health care provider about your test results, treatment options, and if necessary, the need for more tests. Vaccines  Your health care provider may recommend certain vaccines, such as: Influenza vaccine. This is recommended every year. Tetanus, diphtheria, and acellular pertussis (Tdap, Td) vaccine. You may need a Td booster every 10 years. Zoster vaccine. You may need this after age 47. Pneumococcal 13-valent conjugate (PCV13) vaccine. One dose is recommended after age 7. Pneumococcal polysaccharide (PPSV23) vaccine. One dose is recommended  after age 33. Talk to your health care provider about which  screenings and vaccines you need and how often you need them. This information is not intended to replace advice given to you by your health care provider. Make sure you discuss any questions you have with your health care provider. Document Released: 09/21/2015 Document Revised: 05/14/2016 Document Reviewed: 06/26/2015 Elsevier Interactive Patient Education  2017 [employer] Prevention in the Home Falls can cause injuries. They can happen to people of all ages. There are many things you can do to make your home safe and to help prevent falls. What can I do on the outside of my home? Regularly fix the edges of walkways and driveways and fix any cracks. Remove anything that might make you trip as you walk through a door, such as a raised step or threshold. Trim any bushes or trees on the path to your home. Use bright outdoor lighting. Clear any walking paths of anything that might make someone trip, such as rocks or tools. Regularly check to see if handrails are loose or broken. Make sure that both sides of any steps have handrails. Any raised decks and porches should have guardrails on the edges. Have any leaves, snow, or ice cleared regularly. Use sand or salt on walking paths during winter. Clean up any spills in your garage right away. This includes oil or grease spills. What can I do in the bathroom? Use night lights. Install grab bars by the toilet and in the tub and shower. Do not use towel bars as grab bars. Use non-skid mats or decals in the tub or shower. If you need to sit down in the shower, use a plastic, non-slip stool. Keep the floor dry. Clean up any water that spills on the floor as soon as it happens. Remove soap buildup in the tub or shower regularly. Attach bath mats securely with double-sided non-slip rug tape. Do not have throw rugs and other things on the floor that can make you trip. What can I do in the bedroom? Use night lights. Make sure that you have a  light by your bed that is easy to reach. Do not use any sheets or blankets that are too big for your bed. They should not hang down onto the floor. Have a firm chair that has side arms. You can use this for support while you get dressed. Do not have throw rugs and other things on the floor that can make you trip. What can I do in the kitchen? Clean up any spills right away. Avoid walking on wet floors. Keep items that you use a lot in easy-to-reach places. If you need to reach something above you, use a strong step stool that has a grab bar. Keep electrical cords out of the way. Do not use floor polish or wax that makes floors slippery. If you must use wax, use non-skid floor wax. Do not have throw rugs and other things on the floor that can make you trip. What can I do with my stairs? Do not leave any items on the stairs. Make sure that there are handrails on both sides of the stairs and use them. Fix handrails that are broken or loose. Make sure that handrails are as long as the stairways. Check any carpeting to make sure that it is firmly attached to the stairs. Fix any carpet that is loose or worn. Avoid having throw rugs at the top or bottom of the stairs. If you  do have throw rugs, attach them to the floor with carpet tape. Make sure that you have a light switch at the top of the stairs and the bottom of the stairs. If you do not have them, ask someone to add them for you. What else can I do to help prevent falls? Wear shoes that: Do not have high heels. Have rubber bottoms. Are comfortable and fit you well. Are closed at the toe. Do not wear sandals. If you use a stepladder: Make sure that it is fully opened. Do not climb a closed stepladder. Make sure that both sides of the stepladder are locked into place. Ask someone to hold it for you, if possible. Clearly mark and make sure that you can see: Any grab bars or handrails. First and last steps. Where the edge of each step  is. Use tools that help you move around (mobility aids) if they are needed. These include: Canes. Walkers. Scooters. Crutches. Turn on the lights when you go into a dark area. Replace any light bulbs as soon as they burn out. Set up your furniture so you have a clear path. Avoid moving your furniture around. If any of your floors are uneven, fix them. If there are any pets around you, be aware of where they are. Review your medicines with your doctor. Some medicines can make you feel dizzy. This can increase your chance of falling. Ask your doctor what other things that you can do to help prevent falls. This information is not intended to replace advice given to you by your health care provider. Make sure you discuss any questions you have with your health care provider. Document Released: 06/21/2009 Document Revised: 01/31/2016 Document Reviewed: 09/29/2014 Elsevier Interactive Patient Education  2017 [employer].

## 2022-09-23 NOTE — Telephone Encounter (Signed)
[name], esqueda are scheduled for a virtual visit with your provider today.    Just as we do with appointments in the office, we must obtain your consent to participate.  Your consent will be active for this visit and any virtual visit you may have with one of our providers in the next 365 days.    If you have a MyChart account, I can also send a copy of this consent to you electronically.  All virtual visits are billed to your insurance company just like a traditional visit in the office.  As this is a virtual visit, video technology does not allow for your provider to perform a traditional examination.  This may limit your provider's ability to fully assess your condition.  If your provider identifies any concerns that need to be evaluated in person or the need to arrange testing such as labs, EKG, etc, we will make arrangements to do so.    Although advances in technology are sophisticated, we cannot ensure that it will always work on either your end or our end.  If the connection with a video visit is poor, we may have to switch to a telephone visit.  With either a video or telephone visit, we are not always able to ensure that we have a secure connection.   I need to obtain your verbal consent now.   Are you willing to proceed with your visit today?   Barbara Lopez has provided verbal consent on 09/23/2022 for a virtual visit (video or telephone).   Carroll Kinds, CMA 09/23/2022  9:07 AM

## 2022-09-23 NOTE — Progress Notes (Signed)
This service is provided via telemedicine  No vital signs collected/recorded due to the encounter was a telemedicine visit.   Location of patient (ex: home, work):  Home  Patient consents to a telephone visit:  Yes, see encounter dated 09/23/2022  Location of the provider (ex: office, home):  [place]   Name of any referring provider:  N/A  Names of all persons participating in the telemedicine service and their role in the encounter:  Sherrie Mustache, Nurse Practitioner, Carroll Kinds, CMA, and patient.   Time spent on call:  11 minutes with medical assistant

## 2022-09-23 NOTE — Progress Notes (Signed)
Subjective:   Barbara Lopez is a 88 y.o. female who presents for Medicare Annual (Subsequent) preventive examination.  Review of Systems     Cardiac Risk Factors include: advanced age (>85mn, >>35women);family history of premature cardiovascular disease;dyslipidemia     Objective:    There were no vitals filed for this visit. There is no height or weight on file to calculate BMI.     09/23/2022    8:53 AM 02/07/2022    9:26 AM 09/19/2021    8:23 AM 04/05/2021    3:20 PM 10/05/2020    3:35 PM  Advanced Directives  Does Patient Have a Medical Advance Directive? Yes Yes No No No  Type of Advance Directive Out of facility DNR (pink MOST or yellow form) Out of facility DNR (pink MOST or yellow form)     Does patient want to make changes to medical advance directive? No - Patient declined No - Patient declined     Would patient like information on creating a medical advance directive?    Yes (MAU/Ambulatory/Procedural Areas - Information given) Yes (MAU/Ambulatory/Procedural Areas - Information given)  Pre-existing out of facility DNR order (yellow form or pink MOST form) Pink MOST form placed in chart (order not valid for inpatient use) Pink MOST form placed in chart (order not valid for inpatient use)       Current Medications (verified) Outpatient Encounter Medications as of 09/23/2022  Medication Sig   albuterol (VENTOLIN HFA) 108 (90 Base) MCG/ACT inhaler INHALE TWO PUFFS BY MOUTH DAILY AS NEEDED FOR WHEEZING OR SHORTNESS OF BREATH **EMERGENCY USE**   BIOTIN MAXIMUM PO Take 1 tablet by mouth daily.   Calcium Carb-Cholecalciferol (CALCIUM/VITAMIN D PO) Take 2 tablets by mouth daily.   ezetimibe (ZETIA) 10 MG tablet TAKE 1 TABLET BY MOUTH DAILY   hydrOXYzine (ATARAX) 10 MG tablet TAKE 1 TABLET BY MOUTH DAILY AS NEEDED   levothyroxine (SYNTHROID) 75 MCG tablet Take 1 tablet (75 mcg total) by mouth daily.   meloxicam (MOBIC) 15 MG tablet TAKE 1 TABLET BY MOUTH DAILY   Multiple  Vitamins-Minerals (PRESERVISION AREDS PO) Take by mouth in the morning and at bedtime.   Nutritional Supplements (FRUIT & VEGETABLE DAILY) CAPS Take by mouth. 2 fruit and 2 Veggie supplements daily (Balance of Nature)   pantoprazole (PROTONIX) 40 MG tablet TAKE ONE TABLET BY MOUTH DAILY   Tiotropium Bromide-Olodaterol (STIOLTO RESPIMAT) 2.5-2.5 MCG/ACT AERS Inhale 2 puffs into the lungs daily.   venlafaxine (EFFEXOR) 75 MG tablet TAKE ONE TABLET BY MOUTH TWICE A DAY   [DISCONTINUED] Cholecalciferol (VITAMIN D3 PO) Take 1 tablet by mouth in the morning and at bedtime.   [DISCONTINUED] Multiple Vitamins-Minerals (PRESERVISION AREDS PO) Take by mouth.   No facility-administered encounter medications on file as of 09/23/2022.    Allergies (verified) Ace inhibitors, Iodinated contrast media, Iodine, and Other   History: Past Medical History:  Diagnosis Date   Anxiety    Asthma    Bell's palsy    Hearing loss bilateral   Hypothyroidism    Left wrist fracture    Macular degeneration, wet (HCC) nxiety   Non Hodgkin's lymphoma ([facility]    Osteoarthritis    Past Surgical History:  Procedure Laterality Date   APPENDECTOMY     CATARACT EXTRACTION     CHOLECYSTECTOMY     TONSILLECTOMY     TUBAL LIGATION     Family History  Problem Relation Age of Onset   Throat cancer Mother  Heart attack Father    Lung cancer Sister    Dementia Sister    COPD Sister    Obesity Daughter    Pulmonary embolism Daughter    Breast cancer Daughter    Endometriosis Daughter    Hashimoto's thyroiditis Daughter    Social History   Socioeconomic History   Marital status: Widowed    Spouse name: Not on file   Number of children: Not on file   Years of education: Not on file   Highest education level: Not on file  Occupational History   Not on file  Tobacco Use   Smoking status: Never   Smokeless tobacco: Never  Vaping Use   Vaping Use: Never used  Substance and Sexual Activity   Alcohol use:  Never   Drug use: Never   Sexual activity: Not Currently  Other Topics Concern   Not on file  Social History Narrative   Diet: Regular      Do you drink/ eat things with caffeine? Coffee      Marital status:   Widowed                             What year were you married ? 1957      Do you live in a house, apartment,assistred living, condo, trailer, etc.)?  House      Is it one or more stories? 2 Stories      How many persons live in your home ? 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  House Work and Walk to [employer]                            Type & how often  Daily      ADVANCED DIRECTIVES (Please bring copies)      Do you have a living will? No      Do you have a DNR form? No                      If not, do you want to discuss one? Yes      Do you have signed POA?HPOA forms?   No              If so, please bring to your appointment      FUNCTIONAL STATUS- To be completed by Spouse / child / Staff       Do you have difficulty bathing or dressing yourself ?  No      Do you have difficulty preparing food or eating ?  No      Do you have difficulty managing your mediation ?  No      Do you have difficulty managing your finances ?  No      Do you have difficulty affording your medication ?  No      Social Determinants of Radio broadcast assistant Strain: Not on file  Food Insecurity: Not on file  Transportation Needs: Not on file  Physical Activity: Not on file  Stress: Not on file  Social Connections: Not on file    Tobacco Counseling Counseling  given: Not Answered   Clinical Intake:  Pre-visit preparation completed: Yes  Pain : No/denies pain     BMI - recorded: 28 Nutritional Status: BMI 25 -29 Overweight  How often do you need to have someone help you when you read instructions, pamphlets, or  other written materials from your doctor or pharmacy?: 5 - Always  Diabetic?no         Activities of Daily Living    09/23/2022    9:26 AM  In your present state of health, do you have any difficulty performing the following activities:  Hearing? 0  Vision? 0  Difficulty concentrating or making decisions? 1  Walking or climbing stairs? 0  Dressing or bathing? 0  Doing errands, shopping? 0  Preparing Food and eating ? N  Using the Toilet? N  In the past six months, have you accidently leaked urine? N  Do you have problems with loss of bowel control? N  Managing your Medications? N  Managing your Finances? Y  Comment daughter helps  Housekeeping or managing your Housekeeping? N    Patient Care Team: Lauree Chandler, NP as PCP - General (Geriatric Medicine)  Indicate any recent Medical Services you may have received from other than [facility] providers in the past year (date may be approximate).     Assessment:   This is a routine wellness examination for [place].  Hearing/Vision screen Hearing Screening - Comments:: Patient has problems with her hearing. Patient wears hearing aids when she chooses too. Vision Screening - Comments:: Patient wears glasses. Patient recently had eye exam. Patient sees Dr. Ellie Lunch at [facility]. Patient has macular degeneration and sees Dr. Posey Pronto  Dietary issues and exercise activities discussed: Current Exercise Habits: The patient does not participate in regular exercise at present   Goals Addressed   None    Depression Screen    09/23/2022    8:58 AM 08/15/2022   10:32 AM 02/07/2022    9:21 AM 09/19/2021    8:20 AM 04/05/2021    3:19 PM 09/14/2020    8:33 AM 08/01/2020    1:34 PM  PHQ 2/9 Scores  PHQ - 2 Score 0 0 0 0 0 0 0  PHQ- 9 Score  0         Fall Risk    09/23/2022    9:01 AM 08/15/2022   10:32 AM 02/07/2022    9:21 AM 09/19/2021    8:21 AM 08/05/2021    8:38 AM  Fall Risk   Falls in the past year? 0 0 0 0 0   Number falls in past yr: 0 0 0 0 0  Injury with Fall? 0 0 0 0 0  Risk for fall due to : No Fall Risks No Fall Risks No Fall Risks No Fall Risks No Fall Risks  Follow up Falls evaluation completed Falls evaluation completed Falls evaluation completed Falls evaluation completed Falls evaluation completed    FALL RISK PREVENTION PERTAINING TO THE HOME:  Any stairs in or around the home? Yes  If so, are there any without handrails? No  Home free of loose throw rugs in walkways, pet beds, electrical cords, etc? Yes  Adequate lighting in your home to reduce risk of falls? Yes   ASSISTIVE DEVICES UTILIZED TO PREVENT FALLS:  Life alert? No  Use of a cane, walker or w/c? No  Grab bars in the bathroom? Yes  Shower chair or bench in shower? Yes  Elevated toilet seat or a handicapped  toilet? Yes   TIMED UP AND GO:  Was the test performed? No .    Cognitive Function:        09/23/2022    9:02 AM 09/19/2021    8:23 AM 09/14/2020    8:34 AM  6CIT Screen  What Year? 0 points 0 points 0 points  What month? 0 points 0 points 0 points  What time? 0 points 0 points 0 points  Count back from 20 0 points 0 points 0 points  Months in reverse 4 points 4 points 4 points  Repeat phrase 4 points 2 points 6 points  Total Score 8 points 6 points 10 points    Immunizations Immunization History  Administered Date(s) Administered   Fluad Quad(high Dose 65+) 08/01/2020, 06/17/2021   Influenza-Unspecified 06/17/2019   Moderna SARS-COV2 Booster Vaccination 09/11/2020   Moderna Sars-Covid-2 Vaccination 11/04/2019, 12/02/2019   Pfizer Covid-19 Vaccine Bivalent Booster 39yr & up 06/17/2021   Pneumococcal Conjugate-13 07/18/2016   Pneumococcal Polysaccharide-23 04/08/2002   Tdap 12/26/2016   Zoster, Live 07/18/2016    TDAP status: Up to date  Flu Vaccine status: Up to date  Pneumococcal vaccine status: Up to date  Covid-19 vaccine status: Information provided on how to obtain vaccines.    Qualifies for Shingles Vaccine? Yes   Zostavax completed Yes   Shingrix Completed?: No.    Education has been provided regarding the importance of this vaccine. Patient has been advised to call insurance company to determine out of pocket expense if they have not yet received this vaccine. Advised may also receive vaccine at local pharmacy or Health Dept. Verbalized acceptance and understanding.  Screening Tests Health Maintenance  Topic Date Due   Zoster Vaccines- Shingrix (1 of 2) Never done   INFLUENZA VACCINE  04/08/2022   COVID-19 Vaccine (5 - 2023-24 season) 05/09/2022   Medicare Annual Wellness (AWV)  09/24/2023   DTaP/Tdap/Td (2 - Td or Tdap) 12/27/2026   Pneumonia Vaccine 88 Years old  Completed   DEXA SCAN  Completed   HPV VACCINES  Aged Out    Health Maintenance  Health Maintenance Due  Topic Date Due   Zoster Vaccines- Shingrix (1 of 2) Never done   INFLUENZA VACCINE  04/08/2022   COVID-19 Vaccine (5 - 2023-24 season) 05/09/2022    Colorectal cancer screening: No longer required.   Mammogram status: No longer required due to age.  Bone Density status: Completed 7/22. Results reflect: Bone density results: OSTEOPENIA. Repeat every 2 years.  Lung Cancer Screening: (Low Dose CT Chest recommended if Age 86-80years, 30 pack-year currently smoking OR have quit w/in 15years.) does not qualify.   Lung Cancer Screening Referral: na  Additional Screening:  Hepatitis C Screening: does not qualify  Vision Screening: Recommended annual ophthalmology exams for early detection of glaucoma and other disorders of the eye. Is the patient up to date with their annual eye exam?  Yes  Who is the provider or what is the name of the office in which the patient attends annual eye exams? [facility] If pt is not established with a provider, would they like to be referred to a provider to establish care? No .   Dental Screening: Recommended annual dental exams for proper oral  hygiene  Community Resource Referral / Chronic Care Management: CRR required this visit?  No   CCM required this visit?  No      Plan:     I have personally reviewed and noted the following in the patient's  chart:   Medical and social history Use of alcohol, tobacco or illicit drugs  Current medications and supplements including opioid prescriptions. Patient is not currently taking opioid prescriptions. Functional ability and status Nutritional status Physical activity Advanced directives List of other physicians Hospitalizations, surgeries, and ER visits in previous 12 months Vitals Screenings to include cognitive, depression, and falls Referrals and appointments  In addition, I have reviewed and discussed with patient certain preventive protocols, quality metrics, and best practice recommendations. A written personalized care plan for preventive services as well as general preventive health recommendations were provided to patient.     Lauree Chandler, NP   09/23/2022  Carlos American. Harle Battiest  [facility] [phone]    Virtual Visit via Deloris Ping   I connected with patient on 09/23/22 at  9:00 AM EST by video and verified that I am speaking with the correct person using two identifiers.  Location: Patient: home Provider: [place].    I discussed the limitations, risks, security and privacy concerns of performing an evaluation and management service by telephone and the availability of in person appointments. I also discussed with the patient that there may be a patient responsible charge related to this service. The patient expressed understanding and agreed to proceed.   I discussed the assessment and treatment plan with the patient. The patient was provided an opportunity to ask questions and all were answered. The patient agreed with the plan and demonstrated an understanding of the instructions.   The patient was advised to call  back or seek an in-person evaluation if the symptoms worsen or if the condition fails to improve as anticipated.  I provided 15 minutes of non-face-to-face time during this encounter.  Carlos American. Harle Battiest Avs printed and mailed

## 2022-10-14 ENCOUNTER — Other Ambulatory Visit: Payer: Medicare Other

## 2022-10-14 DIAGNOSIS — E039 Hypothyroidism, unspecified: Secondary | ICD-10-CM

## 2022-10-15 LAB — TSH: TSH: 0.99 mIU/L (ref 0.40–4.50)

## 2022-11-17 ENCOUNTER — Other Ambulatory Visit: Payer: Self-pay | Admitting: Nurse Practitioner

## 2022-11-17 DIAGNOSIS — K219 Gastro-esophageal reflux disease without esophagitis: Secondary | ICD-10-CM

## 2023-01-30 ENCOUNTER — Encounter: Payer: Self-pay | Admitting: Adult Health

## 2023-01-30 ENCOUNTER — Ambulatory Visit (INDEPENDENT_AMBULATORY_CARE_PROVIDER_SITE_OTHER): Payer: Medicare Other | Admitting: Adult Health

## 2023-01-30 VITALS — BP 127/87 | HR 91 | Temp 96.7°F | Resp 18 | Ht 62.0 in | Wt 151.2 lb

## 2023-01-30 DIAGNOSIS — E785 Hyperlipidemia, unspecified: Secondary | ICD-10-CM | POA: Diagnosis not present

## 2023-01-30 DIAGNOSIS — F419 Anxiety disorder, unspecified: Secondary | ICD-10-CM | POA: Diagnosis not present

## 2023-01-30 DIAGNOSIS — K219 Gastro-esophageal reflux disease without esophagitis: Secondary | ICD-10-CM

## 2023-01-30 DIAGNOSIS — L821 Other seborrheic keratosis: Secondary | ICD-10-CM

## 2023-01-30 DIAGNOSIS — E039 Hypothyroidism, unspecified: Secondary | ICD-10-CM | POA: Diagnosis not present

## 2023-01-30 DIAGNOSIS — J4520 Mild intermittent asthma, uncomplicated: Secondary | ICD-10-CM

## 2023-01-30 NOTE — Progress Notes (Unsigned)
[facility] clinic  Provider:   Code Status: *** Goals of Care:     01/30/2023    3:36 PM  Advanced Directives  Does Patient Have a Medical Advance Directive? Yes  Type of Advance Directive Out of facility DNR (pink MOST or yellow form)  Does patient want to make changes to medical advance directive? No - Patient declined  Pre-existing out of facility DNR order (yellow form or pink MOST form) Pink MOST form placed in chart (order not valid for inpatient use)     Chief Complaint  Patient presents with   Acute Visit    Lesion on forhead requesting referral for dermatology      HPI: Patient is a 88 y.o. female seen today for an acute visit for  Past Medical History:  Diagnosis Date   Anxiety    Asthma    Bell's palsy    Hearing loss bilateral   Hypothyroidism    Left wrist fracture    Macular degeneration, wet (HCC) nxiety   Non Hodgkin's lymphoma (HCC)    Osteoarthritis     Past Surgical History:  Procedure Laterality Date   APPENDECTOMY     CATARACT EXTRACTION     CHOLECYSTECTOMY     TONSILLECTOMY     TUBAL LIGATION      Allergies  Allergen Reactions   Ace Inhibitors Other (See Comments)   Iodinated Contrast Media    Iodine     Iodine Contrast   Other Rash    Outpatient Encounter Medications as of 01/30/2023  Medication Sig   albuterol (VENTOLIN HFA) 108 (90 Base) MCG/ACT inhaler INHALE TWO PUFFS BY MOUTH DAILY AS NEEDED FOR WHEEZING OR SHORTNESS OF BREATH **EMERGENCY USE**   BIOTIN MAXIMUM PO Take 1 tablet by mouth daily.   Calcium Carb-Cholecalciferol (CALCIUM/VITAMIN D PO) Take 2 tablets by mouth daily.   ezetimibe (ZETIA) 10 MG tablet TAKE 1 TABLET BY MOUTH DAILY   hydrOXYzine (ATARAX) 10 MG tablet TAKE 1 TABLET BY MOUTH DAILY AS NEEDED   levothyroxine (SYNTHROID) 75 MCG tablet Take 1 tablet (75 mcg total) by mouth daily.   meloxicam (MOBIC) 15 MG tablet TAKE 1 TABLET BY MOUTH DAILY   Multiple Vitamins-Minerals (PRESERVISION AREDS PO) Take by mouth in  the morning and at bedtime.   Nutritional Supplements (FRUIT & VEGETABLE DAILY) CAPS Take by mouth. 2 fruit and 2 Veggie supplements daily (Balance of Nature)   pantoprazole (PROTONIX) 40 MG tablet TAKE 1 TABLET BY MOUTH DAILY   Tiotropium Bromide-Olodaterol (STIOLTO RESPIMAT) 2.5-2.5 MCG/ACT AERS Inhale 2 puffs into the lungs daily.   venlafaxine (EFFEXOR) 75 MG tablet TAKE 1 TABLET BY MOUTH TWICE A DAY   No facility-administered encounter medications on file as of 01/30/2023.    Review of Systems:  Review of Systems  Constitutional:  Negative for appetite change, chills, fatigue and fever.  HENT:  Negative for congestion, hearing loss, rhinorrhea and sore throat.   Eyes: Negative.   Respiratory:  Negative for cough, shortness of breath and wheezing.   Cardiovascular:  Negative for chest pain, palpitations and leg swelling.  Gastrointestinal:  Negative for abdominal pain, constipation, diarrhea, nausea and vomiting.  Genitourinary:  Negative for dysuria.  Musculoskeletal:  Negative for arthralgias, back pain and myalgias.  Skin:  Negative for color change, rash and wound.  Neurological:  Negative for dizziness, weakness and headaches.  Psychiatric/Behavioral:  Negative for behavioral problems. The patient is not nervous/anxious.     Health Maintenance  Topic Date Due  Zoster Vaccines- Shingrix (1 of 2) Never done   COVID-19 Vaccine (5 - 2023-24 season) 05/09/2022   INFLUENZA VACCINE  04/09/2023   Medicare Annual Wellness (AWV)  09/24/2023   DTaP/Tdap/Td (2 - Td or Tdap) 12/27/2026   Pneumonia Vaccine 18+ Years old  Completed   DEXA SCAN  Completed   HPV VACCINES  Aged Out    Physical Exam: Vitals:   01/30/23 1529  BP: 127/87  Pulse: 91  Resp: 18  Temp: (!) 96.7 F (35.9 C)  SpO2: 93%  Weight: 151 lb 3.2 oz (68.6 kg)  Height: 5\' 2"  (1.575 m)   Body mass index is 27.65 kg/m. Physical Exam Constitutional:      Appearance: Normal appearance.  HENT:     Head:  Normocephalic and atraumatic.     Nose: Nose normal.     Mouth/Throat:     Mouth: Mucous membranes are moist.  Eyes:     Conjunctiva/sclera: Conjunctivae normal.  Cardiovascular:     Rate and Rhythm: Normal rate and regular rhythm.  Pulmonary:     Effort: Pulmonary effort is normal.     Breath sounds: Normal breath sounds.  Abdominal:     General: Bowel sounds are normal.     Palpations: Abdomen is soft.  Musculoskeletal:        General: Normal range of motion.     Cervical back: Normal range of motion.  Skin:    General: Skin is warm and dry.     Comments: Left brownish discoloration on left forehead   Neurological:     General: No focal deficit present.     Mental Status: She is alert and oriented to person, place, and time.  Psychiatric:        Mood and Affect: Mood normal.        Behavior: Behavior normal.        Thought Content: Thought content normal.        Judgment: Judgment normal.     Labs reviewed: Basic Metabolic Panel: Recent Labs    02/07/22 1028 08/15/22 1026 10/14/22 1545  NA 140 138  --   K 4.5 4.7  --   CL 105 103  --   CO2 25 27  --   GLUCOSE 91 104*  --   BUN 11 19  --   CREATININE 0.79 0.82  --   CALCIUM 9.2 9.4  --   TSH  --  0.26* 0.99   Liver Function Tests: Recent Labs    02/07/22 1028 08/15/22 1026  AST 15 15  ALT 11 11  BILITOT 0.6 0.5  PROT 6.5 6.8   No results for input(s): "LIPASE", "AMYLASE" in the last 8760 hours. No results for input(s): "AMMONIA" in the last 8760 hours. CBC: Recent Labs    02/07/22 1028 08/15/22 1026  WBC 5.6 6.6  NEUTROABS 2,772 3,795  HGB 13.7 14.2  HCT 41.3 43.1  MCV 92.2 91.9  PLT 279 302   Lipid Panel: Recent Labs    02/07/22 1028 08/15/22 1026  CHOL 182 182  HDL 57 67  LDLCALC 102* 99  TRIG 125 73  CHOLHDL 3.2 2.7   Lab Results  Component Value Date   HGBA1C 6.4 (H) 08/15/2022    Procedures since last visit: No results found.  Assessment/Plan     Labs/tests  ordered:    Next appt:  02/16/2023

## 2023-02-05 NOTE — Progress Notes (Incomplete)
[facility] clinic  Provider:  Kenard Gower DNP  Code Status:  DNR  Goals of Care:     01/30/2023    3:36 PM  Advanced Directives  Does Patient Have a Medical Advance Directive? Yes  Type of Advance Directive Out of facility DNR (pink MOST or yellow form)  Does patient want to make changes to medical advance directive? No - Patient declined  Pre-existing out of facility DNR order (yellow form or pink MOST form) Pink MOST form placed in chart (order not valid for inpatient use)     Chief Complaint  Patient presents with  . Acute Visit    Lesion on forhead requesting referral for dermatology      HPI: Patient is a 88 y.o. female seen today for an acute visit for a lesion on her forehead. She was accompanied today by her daughter.  Past Medical History:  Diagnosis Date  . Anxiety   . Asthma   . Bell's palsy   . Hearing loss bilateral  . Hypothyroidism   . Left wrist fracture   . Macular degeneration, wet (HCC) nxiety  . Non Hodgkin's lymphoma (HCC)   . Osteoarthritis     Past Surgical History:  Procedure Laterality Date  . APPENDECTOMY    . CATARACT EXTRACTION    . CHOLECYSTECTOMY    . TONSILLECTOMY    . TUBAL LIGATION      Allergies  Allergen Reactions  . Ace Inhibitors Other (See Comments)  . Iodinated Contrast Media   . Iodine     Iodine Contrast  . Other Rash    Outpatient Encounter Medications as of 01/30/2023  Medication Sig  . albuterol (VENTOLIN HFA) 108 (90 Base) MCG/ACT inhaler INHALE TWO PUFFS BY MOUTH DAILY AS NEEDED FOR WHEEZING OR SHORTNESS OF BREATH **EMERGENCY USE**  . BIOTIN MAXIMUM PO Take 1 tablet by mouth daily.  . Calcium Carb-Cholecalciferol (CALCIUM/VITAMIN D PO) Take 2 tablets by mouth daily.  Marland Kitchen ezetimibe (ZETIA) 10 MG tablet TAKE 1 TABLET BY MOUTH DAILY  . hydrOXYzine (ATARAX) 10 MG tablet TAKE 1 TABLET BY MOUTH DAILY AS NEEDED  . levothyroxine (SYNTHROID) 75 MCG tablet Take 1 tablet (75 mcg total) by mouth daily.  . meloxicam  (MOBIC) 15 MG tablet TAKE 1 TABLET BY MOUTH DAILY  . Multiple Vitamins-Minerals (PRESERVISION AREDS PO) Take by mouth in the morning and at bedtime.  . Nutritional Supplements (FRUIT & VEGETABLE DAILY) CAPS Take by mouth. 2 fruit and 2 Veggie supplements daily (Balance of Nature)  . pantoprazole (PROTONIX) 40 MG tablet TAKE 1 TABLET BY MOUTH DAILY  . Tiotropium Bromide-Olodaterol (STIOLTO RESPIMAT) 2.5-2.5 MCG/ACT AERS Inhale 2 puffs into the lungs daily.  Marland Kitchen venlafaxine (EFFEXOR) 75 MG tablet TAKE 1 TABLET BY MOUTH TWICE A DAY   No facility-administered encounter medications on file as of 01/30/2023.    Review of Systems:  Review of Systems  Constitutional:  Negative for appetite change, chills, fatigue and fever.  HENT:  Negative for congestion, hearing loss, rhinorrhea and sore throat.   Eyes: Negative.   Respiratory:  Negative for cough, shortness of breath and wheezing.   Cardiovascular:  Negative for chest pain, palpitations and leg swelling.  Gastrointestinal:  Negative for abdominal pain, constipation, diarrhea, nausea and vomiting.  Genitourinary:  Negative for dysuria.  Musculoskeletal:  Negative for arthralgias, back pain and myalgias.  Skin:  Negative for color change, rash and wound.  Neurological:  Negative for dizziness, weakness and headaches.  Psychiatric/Behavioral:  Negative for behavioral problems.  The patient is not nervous/anxious.     Health Maintenance  Topic Date Due  . Zoster Vaccines- Shingrix (1 of 2) Never done  . COVID-19 Vaccine (5 - 2023-24 season) 05/09/2022  . INFLUENZA VACCINE  04/09/2023  . Medicare Annual Wellness (AWV)  09/24/2023  . DTaP/Tdap/Td (2 - Td or Tdap) 12/27/2026  . Pneumonia Vaccine 57+ Years old  Completed  . DEXA SCAN  Completed  . HPV VACCINES  Aged Out    Physical Exam: Vitals:   01/30/23 1529  BP: 127/87  Pulse: 91  Resp: 18  Temp: (!) 96.7 F (35.9 C)  SpO2: 93%  Weight: 151 lb 3.2 oz (68.6 kg)  Height: 5\' 2"  (1.575  m)   Body mass index is 27.65 kg/m. Physical Exam Constitutional:      Appearance: Normal appearance.  HENT:     Head: Normocephalic and atraumatic.     Nose: Nose normal.     Mouth/Throat:     Mouth: Mucous membranes are moist.  Eyes:     Conjunctiva/sclera: Conjunctivae normal.  Cardiovascular:     Rate and Rhythm: Normal rate and regular rhythm.  Pulmonary:     Effort: Pulmonary effort is normal.     Breath sounds: Normal breath sounds.  Abdominal:     General: Bowel sounds are normal.     Palpations: Abdomen is soft.  Musculoskeletal:        General: Normal range of motion.     Cervical back: Normal range of motion.  Skin:    General: Skin is warm and dry.     Comments: Left brownish discoloration on left forehead   Neurological:     General: No focal deficit present.     Mental Status: She is alert and oriented to person, place, and time.  Psychiatric:        Mood and Affect: Mood normal.        Behavior: Behavior normal.        Thought Content: Thought content normal.        Judgment: Judgment normal.     Labs reviewed: Basic Metabolic Panel: Recent Labs    02/07/22 1028 08/15/22 1026 10/14/22 1545  NA 140 138  --   K 4.5 4.7  --   CL 105 103  --   CO2 25 27  --   GLUCOSE 91 104*  --   BUN 11 19  --   CREATININE 0.79 0.82  --   CALCIUM 9.2 9.4  --   TSH  --  0.26* 0.99   Liver Function Tests: Recent Labs    02/07/22 1028 08/15/22 1026  AST 15 15  ALT 11 11  BILITOT 0.6 0.5  PROT 6.5 6.8   No results for input(s): "LIPASE", "AMYLASE" in the last 8760 hours. No results for input(s): "AMMONIA" in the last 8760 hours. CBC: Recent Labs    02/07/22 1028 08/15/22 1026  WBC 5.6 6.6  NEUTROABS 2,772 3,795  HGB 13.7 14.2  HCT 41.3 43.1  MCV 92.2 91.9  PLT 279 302   Lipid Panel: Recent Labs    02/07/22 1028 08/15/22 1026  CHOL 182 182  HDL 57 67  LDLCALC 102* 99  TRIG 125 73  CHOLHDL 3.2 2.7   Lab Results  Component Value Date    HGBA1C 6.4 (H) 08/15/2022    Procedures since last visit: No results found.  Assessment/Plan     Labs/tests ordered:    Next appt:  02/16/2023

## 2023-02-15 ENCOUNTER — Other Ambulatory Visit: Payer: Self-pay | Admitting: Nurse Practitioner

## 2023-02-15 DIAGNOSIS — E039 Hypothyroidism, unspecified: Secondary | ICD-10-CM

## 2023-02-16 ENCOUNTER — Encounter: Payer: Self-pay | Admitting: Nurse Practitioner

## 2023-02-16 ENCOUNTER — Ambulatory Visit (INDEPENDENT_AMBULATORY_CARE_PROVIDER_SITE_OTHER): Payer: Medicare Other | Admitting: Nurse Practitioner

## 2023-02-16 VITALS — BP 139/88 | HR 93 | Temp 97.3°F | Resp 18 | Ht 62.0 in | Wt 149.0 lb

## 2023-02-16 DIAGNOSIS — E039 Hypothyroidism, unspecified: Secondary | ICD-10-CM | POA: Diagnosis not present

## 2023-02-16 DIAGNOSIS — R739 Hyperglycemia, unspecified: Secondary | ICD-10-CM

## 2023-02-16 DIAGNOSIS — I10 Essential (primary) hypertension: Secondary | ICD-10-CM | POA: Diagnosis not present

## 2023-02-16 DIAGNOSIS — F419 Anxiety disorder, unspecified: Secondary | ICD-10-CM | POA: Diagnosis not present

## 2023-02-16 DIAGNOSIS — M170 Bilateral primary osteoarthritis of knee: Secondary | ICD-10-CM

## 2023-02-16 DIAGNOSIS — K219 Gastro-esophageal reflux disease without esophagitis: Secondary | ICD-10-CM

## 2023-02-16 DIAGNOSIS — H353290 Exudative age-related macular degeneration, unspecified eye, stage unspecified: Secondary | ICD-10-CM

## 2023-02-16 DIAGNOSIS — E785 Hyperlipidemia, unspecified: Secondary | ICD-10-CM

## 2023-02-16 LAB — CBC WITH DIFFERENTIAL/PLATELET
Basophils Absolute: 41 cells/uL (ref 0–200)
MCV: 91.6 fL (ref 80.0–100.0)
MPV: 9.8 fL (ref 7.5–12.5)
Neutrophils Relative %: 44.5 %
Platelets: 286 10*3/uL (ref 140–400)
RDW: 14.5 % (ref 11.0–15.0)

## 2023-02-16 NOTE — Progress Notes (Signed)
Careteam: Patient Care Team: Sharon Seller, NP as PCP - General (Geriatric Medicine)  PLACE OF SERVICE:  [facility]  Advanced Directive information Does Patient Have a Medical Advance Directive?: Yes, Type of Advance Directive: Out of facility DNR (pink MOST or yellow form), Pre-existing out of facility DNR order (yellow form or pink MOST form): Pink MOST form placed in chart (order not valid for inpatient use), Does patient want to make changes to medical advance directive?: No - Patient declined  Allergies  Allergen Reactions   Ace Inhibitors Other (See Comments)   Iodinated Contrast Media    Iodine     Iodine Contrast   Other Rash    Chief Complaint  Patient presents with   Medical Management of Chronic Issues    Patient is here for a follow up for chronic conditions      HPI: Patient is a 88 y.o. female for routine follow up.  Continues with knee pain. No new pains.   Reports continues to have diarrhea and then will have some constipation.  She does not need or want an intervention. Will have some fecal incontinence.  She will take prunes if she has constipation.   Did not want to be evaluated for OSA due to sleeping away from home.   Anxiety- ongoing, continues on effexor- gets anxious when she has her eye injection. Uses atarax on those days.   GERD- controlled on protonix.   Hypothyroid- TSH at goal on synthroid    Review of Systems:  Review of Systems  Constitutional:  Negative for chills, fever and weight loss.  HENT:  Negative for tinnitus.   Respiratory:  Negative for cough, sputum production and shortness of breath.   Cardiovascular:  Negative for chest pain, palpitations and leg swelling.  Gastrointestinal:  Negative for abdominal pain, constipation, diarrhea and heartburn.  Genitourinary:  Negative for dysuria, frequency and urgency.  Musculoskeletal:  Negative for back pain, falls, joint pain and myalgias.  Skin: Negative.   Neurological:   Negative for dizziness and headaches.  Psychiatric/Behavioral:  Negative for depression and memory loss. The patient does not have insomnia.     Past Medical History:  Diagnosis Date   Anxiety    Asthma    Bell's palsy    Hearing loss bilateral   Hypothyroidism    Left wrist fracture    Macular degeneration, wet (HCC) nxiety   Non Hodgkin's lymphoma (HCC)    Osteoarthritis    Past Surgical History:  Procedure Laterality Date   APPENDECTOMY     CATARACT EXTRACTION     CHOLECYSTECTOMY     TONSILLECTOMY     TUBAL LIGATION     Social History:   reports that she has never smoked. She has never used smokeless tobacco. She reports that she does not drink alcohol and does not use drugs.  Family History  Problem Relation Age of Onset   Throat cancer Mother    Heart attack Father    Lung cancer Sister    Dementia Sister    COPD Sister    Obesity Daughter    Pulmonary embolism Daughter    Breast cancer Daughter    Endometriosis Daughter    Hashimoto's thyroiditis Daughter     Medications: Patient's Medications  New Prescriptions   No medications on file  Previous Medications   ALBUTEROL (VENTOLIN HFA) 108 (90 BASE) MCG/ACT INHALER    INHALE TWO PUFFS BY MOUTH DAILY AS NEEDED FOR WHEEZING OR SHORTNESS OF BREATH **  EMERGENCY USE**   BIOTIN MAXIMUM PO    Take 1 tablet by mouth daily.   CALCIUM CARB-CHOLECALCIFEROL (CALCIUM/VITAMIN D PO)    Take 2 tablets by mouth daily.   EZETIMIBE (ZETIA) 10 MG TABLET    TAKE 1 TABLET BY MOUTH DAILY   HYDROXYZINE (ATARAX) 10 MG TABLET    TAKE 1 TABLET BY MOUTH DAILY AS NEEDED   LEVOTHYROXINE (SYNTHROID) 75 MCG TABLET    TAKE 1 TABLET BY MOUTH DAILY   MELOXICAM (MOBIC) 15 MG TABLET    TAKE 1 TABLET BY MOUTH DAILY   MULTIPLE VITAMINS-MINERALS (PRESERVISION AREDS PO)    Take by mouth in the morning and at bedtime.   NUTRITIONAL SUPPLEMENTS (FRUIT & VEGETABLE DAILY) CAPS    Take by mouth. 2 fruit and 2 Veggie supplements daily (Balance of Nature)    PANTOPRAZOLE (PROTONIX) 40 MG TABLET    TAKE 1 TABLET BY MOUTH DAILY   TIOTROPIUM BROMIDE-OLODATEROL (STIOLTO RESPIMAT) 2.5-2.5 MCG/ACT AERS    Inhale 2 puffs into the lungs daily.   VENLAFAXINE (EFFEXOR) 75 MG TABLET    TAKE 1 TABLET BY MOUTH TWICE A DAY  Modified Medications   No medications on file  Discontinued Medications   No medications on file    Physical Exam:  Vitals:   02/16/23 1523  BP: 139/88  Pulse: 93  Resp: 18  Temp: (!) 97.3 F (36.3 C)  SpO2: 97%  Weight: 149 lb (67.6 kg)  Height: 5\' 2"  (1.575 m)   Body mass index is 27.25 kg/m. Wt Readings from Last 3 Encounters:  02/16/23 149 lb (67.6 kg)  01/30/23 151 lb 3.2 oz (68.6 kg)  08/15/22 155 lb 3.2 oz (70.4 kg)    Physical Exam Constitutional:      General: She is not in acute distress.    Appearance: She is well-developed. She is not diaphoretic.  HENT:     Head: Normocephalic and atraumatic.     Mouth/Throat:     Pharynx: No oropharyngeal exudate.  Eyes:     Conjunctiva/sclera: Conjunctivae normal.     Pupils: Pupils are equal, round, and reactive to light.  Cardiovascular:     Rate and Rhythm: Normal rate and regular rhythm.     Heart sounds: Normal heart sounds.  Pulmonary:     Effort: Pulmonary effort is normal.     Breath sounds: Normal breath sounds.  Abdominal:     General: Bowel sounds are normal.     Palpations: Abdomen is soft.  Musculoskeletal:     Cervical back: Normal range of motion and neck supple.     Right lower leg: No edema.     Left lower leg: No edema.  Skin:    General: Skin is warm and dry.  Neurological:     Mental Status: She is alert and oriented to person, place, and time.  Psychiatric:        Mood and Affect: Mood normal.     Labs reviewed: Basic Metabolic Panel: Recent Labs    08/15/22 1026 10/14/22 1545  NA 138  --   K 4.7  --   CL 103  --   CO2 27  --   GLUCOSE 104*  --   BUN 19  --   CREATININE 0.82  --   CALCIUM 9.4  --   TSH 0.26* 0.99    Liver Function Tests: Recent Labs    08/15/22 1026  AST 15  ALT 11  BILITOT 0.5  PROT 6.8  No results for input(s): "LIPASE", "AMYLASE" in the last 8760 hours. No results for input(s): "AMMONIA" in the last 8760 hours. CBC: Recent Labs    08/15/22 1026  WBC 6.6  NEUTROABS 3,795  HGB 14.2  HCT 43.1  MCV 91.9  PLT 302   Lipid Panel: Recent Labs    08/15/22 1026  CHOL 182  HDL 67  LDLCALC 99  TRIG 73  CHOLHDL 2.7   TSH: Recent Labs    08/15/22 1026 10/14/22 1545  TSH 0.26* 0.99   A1C: Lab Results  Component Value Date   HGBA1C 6.4 (H) 08/15/2022     Assessment/Plan 1. Hyperlipidemia LDL goal <100 -continues on zetia with dietary modifications.  - Lipid panel  2. Anxiety Controlled on effexor with as needed hydroxyzine   3. Acquired hypothyroidism Continues on synthroid, TSH at goal.   4. Primary hypertension -Blood pressure well controlled, goal bp <140/90 Continue current medications and dietary modifications follow metabolic panel - COMPLETE METABOLIC PANEL WITH GFR - CBC with Differential/Platelet  5. Gastroesophageal reflux disease without esophagitis -controlled on protonix   6. Hyperglycemia -continue dietary modifications - Hemoglobin A1c  7. Primary osteoarthritis of both knees -stable on mobic, will follow up bmp   8. Exudative age-related macular degeneration, unspecified laterality, unspecified stage (HCC) -continues with injections per ophthalmology.   Return in about 6 months (around 08/18/2023) for routine follow up.  Janene Harvey. Biagio Borg [facility] [phone]

## 2023-02-17 LAB — COMPLETE METABOLIC PANEL WITH GFR
AG Ratio: 1.8 (calc) (ref 1.0–2.5)
ALT: 14 U/L (ref 6–29)
AST: 14 U/L (ref 10–35)
Albumin: 4.1 g/dL (ref 3.6–5.1)
Alkaline phosphatase (APISO): 74 U/L (ref 37–153)
BUN: 18 mg/dL (ref 7–25)
CO2: 27 mmol/L (ref 20–32)
Calcium: 9.9 mg/dL (ref 8.6–10.4)
Chloride: 104 mmol/L (ref 98–110)
Creat: 0.84 mg/dL (ref 0.60–0.95)
Globulin: 2.3 g/dL (calc) (ref 1.9–3.7)
Glucose, Bld: 108 mg/dL (ref 65–139)
Potassium: 4.3 mmol/L (ref 3.5–5.3)
Sodium: 140 mmol/L (ref 135–146)
Total Bilirubin: 0.5 mg/dL (ref 0.2–1.2)
Total Protein: 6.4 g/dL (ref 6.1–8.1)
eGFR: 68 mL/min/{1.73_m2} (ref >=60)

## 2023-02-17 LAB — CBC WITH DIFFERENTIAL/PLATELET
Absolute Monocytes: 490 cells/uL (ref 200–950)
Basophils Relative: 0.6 %
Eosinophils Absolute: 156 cells/uL (ref 15–500)
Eosinophils Relative: 2.3 %
HCT: 41.6 % (ref 35.0–45.0)
Hemoglobin: 13.7 g/dL (ref 11.7–15.5)
Lymphs Abs: 3087 cells/uL (ref 850–3900)
MCH: 30.2 pg (ref 27.0–33.0)
MCHC: 32.9 g/dL (ref 32.0–36.0)
Monocytes Relative: 7.2 %
Neutro Abs: 3026 cells/uL (ref 1500–7800)
RBC: 4.54 10*6/uL (ref 3.80–5.10)
Total Lymphocyte: 45.4 %
WBC: 6.8 10*3/uL (ref 3.8–10.8)

## 2023-02-17 LAB — HEMOGLOBIN A1C
Hgb A1c MFr Bld: 6.3 % of total Hgb — ABNORMAL HIGH (ref <5.7)
Mean Plasma Glucose: 134 mg/dL
eAG (mmol/L): 7.4 mmol/L

## 2023-02-17 LAB — LIPID PANEL
Cholesterol: 169 mg/dL (ref <200)
HDL: 51 mg/dL (ref >=50)
LDL Cholesterol (Calc): 100 mg/dL (calc) — ABNORMAL HIGH
Non-HDL Cholesterol (Calc): 118 mg/dL (calc) (ref <130)
Total CHOL/HDL Ratio: 3.3 (calc) (ref <5.0)
Triglycerides: 88 mg/dL (ref <150)

## 2023-04-29 ENCOUNTER — Other Ambulatory Visit: Payer: Self-pay | Admitting: Nurse Practitioner

## 2023-05-12 ENCOUNTER — Ambulatory Visit (HOSPITAL_COMMUNITY)
Admission: EM | Admit: 2023-05-12 | Discharge: 2023-05-12 | Disposition: A | Discharge status: Home / Self Care | Payer: Medicare Other

## 2023-05-12 ENCOUNTER — Encounter (HOSPITAL_COMMUNITY): Payer: Self-pay

## 2023-05-12 ENCOUNTER — Encounter: Payer: Self-pay | Admitting: Nurse Practitioner

## 2023-05-12 ENCOUNTER — Emergency Department (HOSPITAL_COMMUNITY): Payer: Medicare Other

## 2023-05-12 ENCOUNTER — Other Ambulatory Visit: Payer: Self-pay

## 2023-05-12 ENCOUNTER — Emergency Department (HOSPITAL_COMMUNITY)
Admission: EM | Admit: 2023-05-12 | Discharge: 2023-05-12 | Disposition: A | Discharge status: Home / Self Care | Payer: Medicare Other | Attending: Emergency Medicine | Admitting: Emergency Medicine

## 2023-05-12 DIAGNOSIS — S0101XA Laceration without foreign body of scalp, initial encounter: Secondary | ICD-10-CM | POA: Diagnosis not present

## 2023-05-12 DIAGNOSIS — W06XXXA Fall from bed, initial encounter: Secondary | ICD-10-CM | POA: Diagnosis not present

## 2023-05-12 DIAGNOSIS — S0990XA Unspecified injury of head, initial encounter: Secondary | ICD-10-CM | POA: Diagnosis present

## 2023-05-12 DIAGNOSIS — W19XXXA Unspecified fall, initial encounter: Secondary | ICD-10-CM

## 2023-05-12 MED ORDER — LIDOCAINE-EPINEPHRINE-TETRACAINE (LET) TOPICAL GEL
3.0000 mL | Freq: Once | TOPICAL | Status: AC
  Administered 2023-05-12: 3 mL via TOPICAL
  Filled 2023-05-12: qty 3

## 2023-05-12 MED ORDER — LIDOCAINE-EPINEPHRINE (PF) 2 %-1:200000 IJ SOLN
5.0000 mL | Freq: Once | INTRAMUSCULAR | Status: DC
  Filled 2023-05-12: qty 20

## 2023-05-12 MED ORDER — ACETAMINOPHEN 325 MG PO TABS
650.0000 mg | ORAL_TABLET | Freq: Once | ORAL | Status: AC
  Administered 2023-05-12: 650 mg via ORAL
  Filled 2023-05-12: qty 2

## 2023-05-12 NOTE — ED Triage Notes (Signed)
Pt states she rolled off the bed this am and hit head; no loc; not on thinners; c/o pain to back of head; lac to L scalp, bleeding controlled

## 2023-05-12 NOTE — ED Provider Notes (Signed)
EMERGENCY DEPARTMENT AT [facility] Provider Note   CSN: [id number] Arrival date & time: 05/12/23  8657     History  Chief Complaint  Patient presents with   Fall    Jamin SHELITHA MCCRIMMON is a 88 y.o. female.  Patient is a 88 yo female presenting for fall. Patient's daughter is here and sleeps in the same room. States her mother rolled off the bed this morning and hit the side of her head on the bedside table. Bleeding controlled at this time. Denies LOC. Denis blood thinner use. Patient has a hx of Bell's Palsy with residual left sided facial deficits but otherwise no new neurovascular changes. She admits to midline neck pain.   The history is provided by the patient. No language interpreter was used.  Fall Pertinent negatives include no chest pain, no abdominal pain and no shortness of breath.       Home Medications Prior to Admission medications   Medication Sig Start Date End Date Taking? Authorizing Provider  albuterol (VENTOLIN HFA) 108 (90 Base) MCG/ACT inhaler INHALE TWO PUFFS BY MOUTH DAILY AS NEEDED FOR WHEEZING OR SHORTNESS OF BREATH **EMERGENCY USE** 05/05/22   Sharon Seller, NP  BIOTIN MAXIMUM PO Take 1 tablet by mouth daily.    [provider]  Calcium Carb-Cholecalciferol (CALCIUM/VITAMIN D PO) Take 2 tablets by mouth daily.    [provider]  ezetimibe (ZETIA) 10 MG tablet TAKE 1 TABLET BY MOUTH DAILY 02/16/23   Sharon Seller, NP  hydrOXYzine (ATARAX) 10 MG tablet TAKE 1 TABLET BY MOUTH DAILY AS NEEDED 09/02/22   Sharon Seller, NP  levothyroxine (SYNTHROID) 75 MCG tablet TAKE 1 TABLET BY MOUTH DAILY 02/16/23   Sharon Seller, NP  meloxicam (MOBIC) 15 MG tablet TAKE 1 TABLET BY MOUTH DAILY 09/03/22   Kathryne Hitch, MD  Multiple Vitamins-Minerals (PRESERVISION AREDS PO) Take by mouth in the morning and at bedtime.    [provider]  Nutritional Supplements (FRUIT & VEGETABLE DAILY) CAPS Take by  mouth. 2 fruit and 2 Veggie supplements daily (Balance of Nature)    [provider]  pantoprazole (PROTONIX) 40 MG tablet TAKE 1 TABLET BY MOUTH DAILY 11/17/22   Ngetich, Dinah C, NP  STIOLTO RESPIMAT 2.5-2.5 MCG/ACT AERS INHALE 2 INHALATION(S) BY MOUTH DAILY 04/29/23   Sharon Seller, NP  venlafaxine (EFFEXOR) 75 MG tablet TAKE 1 TABLET BY MOUTH TWICE A DAY 11/17/22   Ngetich, Dinah C, NP      Allergies    Ace inhibitors, Iodinated contrast media, Iodine, and Other    Review of Systems   Review of Systems  Constitutional:  Negative for chills and fever.  HENT:  Negative for ear pain and sore throat.   Eyes:  Negative for pain and visual disturbance.  Respiratory:  Negative for cough and shortness of breath.   Cardiovascular:  Negative for chest pain and palpitations.  Gastrointestinal:  Negative for abdominal pain and vomiting.  Genitourinary:  Negative for dysuria and hematuria.  Musculoskeletal:  Positive for neck pain. Negative for arthralgias and back pain.  Skin:  Positive for wound. Negative for color change and rash.  Neurological:  Negative for seizures and syncope.  All other systems reviewed and are negative.   Physical Exam Updated Vital Signs BP 123/70   Pulse 90   Temp (!) 97.5 F (36.4 C) (Oral)   Resp 16   Ht 5\' 2"  (1.575 m)   Wt 68 kg  SpO2 95%   BMI 27.44 kg/m  Physical Exam Vitals and nursing note reviewed.  Constitutional:      General: She is not in acute distress.    Appearance: She is well-developed.  HENT:     Head: Normocephalic and atraumatic.      Comments:  2 cm laceration to the left parietal region. Bleeding controlled. No foreign bodies Eyes:     Conjunctiva/sclera: Conjunctivae normal.  Cardiovascular:     Rate and Rhythm: Normal rate and regular rhythm.     Heart sounds: No murmur heard. Pulmonary:     Effort: Pulmonary effort is normal. No respiratory distress.     Breath sounds: Normal breath sounds.  Abdominal:      Palpations: Abdomen is soft.     Tenderness: There is no abdominal tenderness.  Musculoskeletal:        General: No swelling.     Cervical back: Neck supple. Bony tenderness present.     Thoracic back: No bony tenderness.     Lumbar back: No bony tenderness.  Skin:    General: Skin is warm and dry.     Capillary Refill: Capillary refill takes less than 2 seconds.  Neurological:     Mental Status: She is alert and oriented to person, place, and time.     GCS: GCS eye subscore is 4. GCS verbal subscore is 5. GCS motor subscore is 6.     Cranial Nerves: Cranial nerves 2-12 are intact.     Sensory: Sensation is intact.     Motor: Motor function is intact.     Comments: Left sided frontalis weakness and facial asymmetry.   Psychiatric:        Mood and Affect: Mood normal.     ED Results / Procedures / Treatments   Labs (all labs ordered are listed, but only abnormal results are displayed) Labs Reviewed - No data to display  EKG None  Radiology CT Head Wo Contrast  Result Date: 05/12/2023 CLINICAL DATA:  Head trauma, minor (Age >= 65y); Neck trauma (Age >= 65y). Head pain. Scalp laceration EXAM: CT HEAD WITHOUT CONTRAST CT CERVICAL SPINE WITHOUT CONTRAST TECHNIQUE: Multidetector CT imaging of the head and cervical spine was performed following the standard protocol without intravenous contrast. Multiplanar CT image reconstructions of the cervical spine were also generated. RADIATION DOSE REDUCTION: This exam was performed according to the departmental dose-optimization program which includes automated exposure control, adjustment of the mA and/or kV according to patient size and/or use of iterative reconstruction technique. COMPARISON:  None Available. FINDINGS: CT HEAD FINDINGS Brain: No evidence of acute infarction, hemorrhage, hydrocephalus, extra-axial collection or mass lesion/mass effect. Cavum septum pellucidum. Mild low-density changes within the periventricular and subcortical  white matter most compatible with chronic microvascular ischemic change. Mild-moderate diffuse cerebral volume loss. Vascular: Atherosclerotic calcifications involving the large vessels of the skull base. No unexpected hyperdense vessel. Skull: Normal. Negative for fracture or focal lesion. Sinuses/Orbits: No acute finding. Other: None. CT CERVICAL SPINE FINDINGS Alignment: Facet joints are aligned without dislocation or traumatic listhesis. Dens and lateral masses are aligned. Reversal of the cervical lordosis with degenerative grade 1 anterolisthesis of C2 on C3, C3 on C4, and C4 on C5. Skull base and vertebrae: No acute fracture. No primary bone lesion or focal pathologic process. Soft tissues and spinal canal: No prevertebral fluid or swelling. No visible canal hematoma. Disc levels: Degenerative disc disease at C5-6 and C6-7. Severe multilevel facet arthropathy is worse on the right. Upper  chest: Negative. Other: Degenerative changes of the sternoclavicular joints. Bilateral carotid atherosclerosis. IMPRESSION: 1. No acute intracranial abnormality. 2. No acute fracture or subluxation of the cervical spine. Electronically Signed   By: Duanne Guess D.O.   On: 05/12/2023 10:42   CT Cervical Spine Wo Contrast  Result Date: 05/12/2023 CLINICAL DATA:  Head trauma, minor (Age >= 65y); Neck trauma (Age >= 65y). Head pain. Scalp laceration EXAM: CT HEAD WITHOUT CONTRAST CT CERVICAL SPINE WITHOUT CONTRAST TECHNIQUE: Multidetector CT imaging of the head and cervical spine was performed following the standard protocol without intravenous contrast. Multiplanar CT image reconstructions of the cervical spine were also generated. RADIATION DOSE REDUCTION: This exam was performed according to the departmental dose-optimization program which includes automated exposure control, adjustment of the mA and/or kV according to patient size and/or use of iterative reconstruction technique. COMPARISON:  None Available. FINDINGS:  CT HEAD FINDINGS Brain: No evidence of acute infarction, hemorrhage, hydrocephalus, extra-axial collection or mass lesion/mass effect. Cavum septum pellucidum. Mild low-density changes within the periventricular and subcortical white matter most compatible with chronic microvascular ischemic change. Mild-moderate diffuse cerebral volume loss. Vascular: Atherosclerotic calcifications involving the large vessels of the skull base. No unexpected hyperdense vessel. Skull: Normal. Negative for fracture or focal lesion. Sinuses/Orbits: No acute finding. Other: None. CT CERVICAL SPINE FINDINGS Alignment: Facet joints are aligned without dislocation or traumatic listhesis. Dens and lateral masses are aligned. Reversal of the cervical lordosis with degenerative grade 1 anterolisthesis of C2 on C3, C3 on C4, and C4 on C5. Skull base and vertebrae: No acute fracture. No primary bone lesion or focal pathologic process. Soft tissues and spinal canal: No prevertebral fluid or swelling. No visible canal hematoma. Disc levels: Degenerative disc disease at C5-6 and C6-7. Severe multilevel facet arthropathy is worse on the right. Upper chest: Negative. Other: Degenerative changes of the sternoclavicular joints. Bilateral carotid atherosclerosis. IMPRESSION: 1. No acute intracranial abnormality. 2. No acute fracture or subluxation of the cervical spine. Electronically Signed   By: Duanne Guess D.O.   On: 05/12/2023 10:42    Procedures .Marland KitchenLaceration Repair  Date/Time: 05/12/2023 11:06 AM  Performed by: Franne Forts, DO Authorized by: Franne Forts, DO   Consent:    Consent obtained:  Verbal   Consent given by:  Patient   Risks, benefits, and alternatives were discussed: yes     Risks discussed:  Infection, pain and poor wound healing   Alternatives discussed:  No treatment Universal protocol:    Immediately prior to procedure, a time out was called: yes     Patient identity confirmed:  Verbally with patient, arm  band and provided demographic data Anesthesia:    Anesthesia method:  Topical application   Topical anesthetic:  LET Laceration details:    Location:  Scalp   Scalp location:  L parietal   Length (cm):  2 Exploration:    Limited defect created (wound extended): no   Treatment:    Amount of cleaning:  Standard   Irrigation solution:  Sterile saline Skin repair:    Repair method:  Staples Approximation:    Approximation:  Close Repair type:    Repair type:  Simple Post-procedure details:    Procedure completion:  Tolerated well, no immediate complications     Medications Ordered in ED Medications  acetaminophen (TYLENOL) tablet 650 mg (650 mg Oral Given 05/12/23 0849)  lidocaine-EPINEPHrine-tetracaine (LET) topical gel (3 mLs Topical Given 05/12/23 1610)    ED Course/ Medical Decision Making/ A&P  Medical Decision Making Amount and/or Complexity of Data Reviewed Radiology: ordered.  Risk OTC drugs. Prescription drug management.   11:06 AM 88 yo female presenting for fall and head lac. Pt is Aox3, no acute distress, afebrile, with stable vitals. No new neurovascular deficits on exam. 2 cm laceration to the left parietal region. Bleeding controlled. No foreign bodies. Midline cervical spinal pain present.   CT head and neck demonstrates:  LET applied, wound irrigated, and closed with approx 3 stables. Wound care education provided. Patient recommended for follow up in 7 days for staple removal.   Patient in no distress and overall condition improved here in the ED. Detailed discussions were had with the patient regarding current findings, and need for close f/u with PCP or on call doctor. The patient has been instructed to return immediately if the symptoms worsen in any way for re-evaluation. Patient verbalized understanding and is in agreement with current care plan. All questions answered prior to discharge.         Final Clinical  Impression(s) / ED Diagnoses Final diagnoses:  Fall, initial encounter  Injury of head, initial encounter  Laceration of scalp, initial encounter    Rx / DC Orders ED Discharge Orders     None         Franne Forts, DO 05/12/23 1107

## 2023-05-12 NOTE — Discharge Instructions (Signed)
Follow-up with your primary care physician for staple removal in the next 7 to 10 days.

## 2023-05-21 ENCOUNTER — Encounter: Payer: Self-pay | Admitting: Orthopedic Surgery

## 2023-05-21 ENCOUNTER — Ambulatory Visit (INDEPENDENT_AMBULATORY_CARE_PROVIDER_SITE_OTHER): Payer: Medicare Other | Admitting: Orthopedic Surgery

## 2023-05-21 ENCOUNTER — Other Ambulatory Visit: Payer: Self-pay

## 2023-05-21 VITALS — BP 136/74 | HR 93 | Temp 97.9°F | Resp 16 | Ht 62.0 in | Wt 148.0 lb

## 2023-05-21 DIAGNOSIS — K219 Gastro-esophageal reflux disease without esophagitis: Secondary | ICD-10-CM

## 2023-05-21 DIAGNOSIS — S0101XD Laceration without foreign body of scalp, subsequent encounter: Secondary | ICD-10-CM

## 2023-05-21 MED ORDER — PANTOPRAZOLE SODIUM 40 MG PO TBEC: 40.0000 mg | DELAYED_RELEASE_TABLET | Freq: Every day | ORAL | 3 refills | Status: DC

## 2023-05-21 NOTE — Progress Notes (Signed)
Careteam: Patient Care Team: Sharon Seller, NP as PCP - General (Geriatric Medicine)  Seen by: Hazle Nordmann, AGNP-C  PLACE OF SERVICE:  [facility]  Advanced Directive information Does Patient Have a Medical Advance Directive?: Yes, Type of Advance Directive: Healthcare Power of [place];Living will;Out of facility DNR (pink MOST or yellow form), Does patient want to make changes to medical advance directive?: No - Patient declined  Allergies  Allergen Reactions   Ace Inhibitors Other (See Comments)   Iodinated Contrast Media    Iodine     Iodine Contrast   Other Rash    Chief Complaint  Patient presents with   Procedure    Suture removal on left side of head.      HPI: Patient is a 88 y.o. female seen today for acute visit due to staple removal.   09/03 fall with head injury. 3 staples to left head laceration. She denies pain or purulent drainage to injury. No headaches or N/V. Afebrile. Vitals stable.     Review of Systems:  Review of Systems  Constitutional:  Negative for fever.  Respiratory:  Negative for shortness of breath.   Cardiovascular:  Negative for chest pain.  Skin:  Negative for rash.       Head laceration  Psychiatric/Behavioral:  Negative for depression.     Past Medical History:  Diagnosis Date   Anxiety    Asthma    Bell's palsy    Hearing loss bilateral   Hypothyroidism    Left wrist fracture    Macular degeneration, wet (HCC) nxiety   Non Hodgkin's lymphoma (HCC)    Osteoarthritis    Past Surgical History:  Procedure Laterality Date   APPENDECTOMY     CATARACT EXTRACTION     CHOLECYSTECTOMY     TONSILLECTOMY     TUBAL LIGATION     Social History:   reports that she has never smoked. She has never used smokeless tobacco. She reports that she does not drink alcohol and does not use drugs.  Family History  Problem Relation Age of Onset   Throat cancer Mother    Heart attack Father    Lung cancer Sister    Dementia Sister     COPD Sister    Obesity Daughter    Pulmonary embolism Daughter    Breast cancer Daughter    Endometriosis Daughter    Hashimoto's thyroiditis Daughter     Medications: Patient's Medications  New Prescriptions   No medications on file  Previous Medications   ALBUTEROL (VENTOLIN HFA) 108 (90 BASE) MCG/ACT INHALER    INHALE TWO PUFFS BY MOUTH DAILY AS NEEDED FOR WHEEZING OR SHORTNESS OF BREATH **EMERGENCY USE**   BIOTIN MAXIMUM PO    Take 1 tablet by mouth daily.   CALCIUM CARB-CHOLECALCIFEROL (CALCIUM/VITAMIN D PO)    Take 2 tablets by mouth daily.   EZETIMIBE (ZETIA) 10 MG TABLET    TAKE 1 TABLET BY MOUTH DAILY   HYDROXYZINE (ATARAX) 10 MG TABLET    TAKE 1 TABLET BY MOUTH DAILY AS NEEDED   LEVOTHYROXINE (SYNTHROID) 75 MCG TABLET    TAKE 1 TABLET BY MOUTH DAILY   MELOXICAM (MOBIC) 15 MG TABLET    TAKE 1 TABLET BY MOUTH DAILY   MULTIPLE VITAMINS-MINERALS (PRESERVISION AREDS PO)    Take by mouth in the morning and at bedtime.   NUTRITIONAL SUPPLEMENTS (FRUIT & VEGETABLE DAILY) CAPS    Take by mouth. 2 fruit and 2 Veggie supplements daily (  Balance of Nature)   PANTOPRAZOLE (PROTONIX) 40 MG TABLET    TAKE 1 TABLET BY MOUTH DAILY   STIOLTO RESPIMAT 2.5-2.5 MCG/ACT AERS    INHALE 2 INHALATION(S) BY MOUTH DAILY   VENLAFAXINE (EFFEXOR) 75 MG TABLET    TAKE 1 TABLET BY MOUTH TWICE A DAY  Modified Medications   No medications on file  Discontinued Medications   No medications on file    Physical Exam:  Vitals:   05/21/23 1437  BP: 136/74  Pulse: 93  Resp: 16  Temp: 97.9 F (36.6 C)  SpO2: 95%  Weight: 148 lb (67.1 kg)  Height: 5\' 2"  (1.575 m)   Body mass index is 27.07 kg/m. Wt Readings from Last 3 Encounters:  05/21/23 148 lb (67.1 kg)  05/12/23 150 lb (68 kg)  02/16/23 149 lb (67.6 kg)    Physical Exam Vitals reviewed.  Constitutional:      General: She is not in acute distress. HENT:     Head: Normocephalic and atraumatic.  Cardiovascular:     Rate and Rhythm:  Normal rate and regular rhythm.     Pulses: Normal pulses.     Heart sounds: Normal heart sounds.  Pulmonary:     Effort: Pulmonary effort is normal.     Breath sounds: Normal breath sounds.  Abdominal:     Palpations: Abdomen is soft.  Skin:    General: Skin is warm.     Capillary Refill: Capillary refill takes less than 2 seconds.     Comments: 3 staples to left head laceration tolerated well, scab intact, no sign of infection  Neurological:     General: No focal deficit present.     Mental Status: She is alert.  Psychiatric:        Mood and Affect: Mood normal.     Labs reviewed: Basic Metabolic Panel: Recent Labs    08/15/22 1026 10/14/22 1545 02/16/23 1548  NA 138  --  140  K 4.7  --  4.3  CL 103  --  104  CO2 27  --  27  GLUCOSE 104*  --  108  BUN 19  --  18  CREATININE 0.82  --  0.84  CALCIUM 9.4  --  9.9  TSH 0.26* 0.99  --    Liver Function Tests: Recent Labs    08/15/22 1026 02/16/23 1548  AST 15 14  ALT 11 14  BILITOT 0.5 0.5  PROT 6.8 6.4   No results for input(s): "LIPASE", "AMYLASE" in the last 8760 hours. No results for input(s): "AMMONIA" in the last 8760 hours. CBC: Recent Labs    08/15/22 1026 02/16/23 1548  WBC 6.6 6.8  NEUTROABS 3,795 3,026  HGB 14.2 13.7  HCT 43.1 41.6  MCV 91.9 91.6  PLT 302 286   Lipid Panel: Recent Labs    08/15/22 1026 02/16/23 1548  CHOL 182 169  HDL 67 51  LDLCALC 99 100*  TRIG 73 88  CHOLHDL 2.7 3.3   TSH: Recent Labs    08/15/22 1026 10/14/22 1545  TSH 0.26* 0.99   A1C: Lab Results  Component Value Date   HGBA1C 6.3 (H) 02/16/2023     Assessment/Plan 1. Laceration of scalp without foreign body, subsequent encounter - 09/03 rolled out of bed> left head laceration - CT head> no acute intracranial abnormality - 3 staples removed today> tolerated well - no sign of infection to wound - advised to shower, avoid vigorous scrubbing  - notify provider if  area becomes painful, swollen, red  or pus drainage  Total time: 14 minutes. Greater than 50% of total time spent doing patient education regarding head injury including signs of infection and wound care.    Next appt: Visit date not found  Shaquisha Wynn Scherry Ran  [facility] [phone]

## 2023-05-21 NOTE — Patient Instructions (Signed)
Ok to shower> use gentle baby shampoo  If you experience increased pain, swelling, redness or pus drainage> please let provider know

## 2023-05-22 ENCOUNTER — Other Ambulatory Visit: Payer: Self-pay | Admitting: *Deleted

## 2023-05-22 MED ORDER — VENLAFAXINE HCL 75 MG PO TABS: 75.0000 mg | ORAL_TABLET | Freq: Two times a day (BID) | ORAL | 1 refills | Status: DC

## 2023-05-22 NOTE — Telephone Encounter (Signed)
Karin Golden Requested refill.

## 2023-05-25 ENCOUNTER — Ambulatory Visit: Payer: Medicare Other | Admitting: Adult Health

## 2023-06-04 ENCOUNTER — Ambulatory Visit: Payer: Medicare Other | Admitting: Dermatology

## 2023-06-04 ENCOUNTER — Encounter: Payer: Self-pay | Admitting: Dermatology

## 2023-06-04 VITALS — BP 113/75

## 2023-06-04 DIAGNOSIS — L821 Other seborrheic keratosis: Secondary | ICD-10-CM

## 2023-06-04 DIAGNOSIS — L570 Actinic keratosis: Secondary | ICD-10-CM | POA: Diagnosis not present

## 2023-06-04 DIAGNOSIS — L814 Other melanin hyperpigmentation: Secondary | ICD-10-CM

## 2023-06-04 NOTE — Patient Instructions (Signed)
Cryotherapy Aftercare  Wash gently with soap and water everyday.   Apply Vaseline and Band-Aid daily until healed.  Important Information  Due to recent changes in healthcare laws, you may see results of your pathology and/or laboratory studies on MyChart before the doctors have had a chance to review them. We understand that in some cases there may be results that are confusing or concerning to you. Please understand that not all results are received at the same time and often the doctors may need to interpret multiple results in order to provide you with the best plan of care or course of treatment. Therefore, we ask that you please give [place] 2 business days to thoroughly review all your results before contacting the office for clarification. Should we see a critical lab result, you will be contacted sooner.   If You Need Anything After Your Visit  If you have any questions or concerns for your doctor, please call our main line at [phone] If no one answers, please leave a voicemail as directed and we will return your call as soon as possible. Messages left after 4 pm will be answered the following business day.   You may also send [place] a message via MyChart. We typically respond to MyChart messages within 1-2 business days.  For prescription refills, please ask your pharmacy to contact our office. Our fax number is [phone].  If you have an urgent issue when the clinic is closed that cannot wait until the next business day, you can page your doctor at the number below.    Please note that while we do our best to be available for urgent issues outside of office hours, we are not available 24/7.   If you have an urgent issue and are unable to reach [place], you may choose to seek medical care at your doctor's office, retail clinic, urgent care center, or emergency room.  If you have a medical emergency, please immediately call 911 or go to the emergency department. In the event of inclement  weather, please call our main line at [phone] for an update on the status of any delays or closures.  Dermatology Medication Tips: Please keep the boxes that topical medications come in in order to help keep track of the instructions about where and how to use these. Pharmacies typically print the medication instructions only on the boxes and not directly on the medication tubes.   If your medication is too expensive, please contact our office at [phone] or send [place] a message through MyChart.   We are unable to tell what your co-pay for medications will be in advance as this is different depending on your insurance coverage. However, we may be able to find a substitute medication at lower cost or fill out paperwork to get insurance to cover a needed medication.   If a prior authorization is required to get your medication covered by your insurance company, please allow [place] 1-2 business days to complete this process.  Drug prices often vary depending on where the prescription is filled and some pharmacies may offer cheaper prices.  The website www.goodrx.com contains coupons for medications through different pharmacies. The prices here do not account for what the cost may be with help from insurance (it may be cheaper with your insurance), but the website can give you the price if you did not use any insurance.  - You can print the associated coupon and take it with your prescription to the pharmacy.  - You  may also stop by our office during regular business hours and pick up a GoodRx coupon card.  - If you need your prescription sent electronically to a different pharmacy, notify our office through [facility] or by phone at [phone]

## 2023-06-04 NOTE — Progress Notes (Signed)
   New Patient Visit   Subjective  Barbara Lopez is a 87 y.o. female who presents for the following: Spots of left temple and right cheek. The one on the left temple has changed colors and gotten bigger. The one on right cheek is new since the summer. She had Non-Hodgkins Lymphoma about 25 years ago.  Accompanied by daughter  The following portions of the chart were reviewed this encounter and updated as appropriate: medications, allergies, medical history  Review of Systems:  No other skin or systemic complaints except as noted in HPI or Assessment and Plan.  Objective  Well appearing patient in no apparent distress; mood and affect are within normal limits.   A focused examination was performed of the following areas: Face   Relevant exam findings are noted in the Assessment and Plan.  Right Hand Erythematous thin papules/macules with gritty scale.     Assessment & Plan   LENTIGO of right cheek Exam: Tan macule Due to sun exposure Treatment Plan: Benign-appearing, observe. Recommend daily broad spectrum sunscreen SPF 30+ to sun-exposed areas, reapply every 2 hours as needed.  Call for any changes  SEBORRHEIC KERATOSIS - Stuck-on, waxy, tan-brown papules and/or plaques  - Benign-appearing - Discussed benign etiology and prognosis. - Observe - Call for any changes  AK (actinic keratosis) Right Hand  Destruction of lesion - Right Hand Complexity: simple   Destruction method: cryotherapy   Informed consent: discussed and consent obtained   Timeout:  patient name, date of birth, surgical site, and procedure verified Lesion destroyed using liquid nitrogen: Yes   Region frozen until ice ball extended beyond lesion: Yes   Outcome: patient tolerated procedure well with no complications   Post-procedure details: wound care instructions given      Return in about 2 months (around 08/04/2023) for TBSE, AK follow up.  I, Joanie Coddington, CMA, am acting as scribe for  [employer], DO .   Documentation: I have reviewed the above documentation for accuracy and completeness, and I agree with the above.  Langston Reusing, DO

## 2023-08-05 ENCOUNTER — Ambulatory Visit: Payer: Medicare Other | Admitting: Dermatology

## 2023-08-14 ENCOUNTER — Other Ambulatory Visit: Payer: Self-pay | Admitting: Nurse Practitioner

## 2023-08-14 ENCOUNTER — Other Ambulatory Visit: Payer: Self-pay | Admitting: Orthopaedic Surgery

## 2023-08-14 DIAGNOSIS — E039 Hypothyroidism, unspecified: Secondary | ICD-10-CM

## 2023-08-17 ENCOUNTER — Encounter: Payer: Self-pay | Admitting: Dermatology

## 2023-08-17 ENCOUNTER — Ambulatory Visit: Payer: Medicare Other | Admitting: Dermatology

## 2023-08-17 VITALS — BP 113/67 | HR 94

## 2023-08-17 DIAGNOSIS — D485 Neoplasm of uncertain behavior of skin: Secondary | ICD-10-CM

## 2023-08-17 DIAGNOSIS — D229 Melanocytic nevi, unspecified: Secondary | ICD-10-CM

## 2023-08-17 DIAGNOSIS — L814 Other melanin hyperpigmentation: Secondary | ICD-10-CM

## 2023-08-17 DIAGNOSIS — Z1283 Encounter for screening for malignant neoplasm of skin: Secondary | ICD-10-CM | POA: Diagnosis not present

## 2023-08-17 DIAGNOSIS — D492 Neoplasm of unspecified behavior of bone, soft tissue, and skin: Secondary | ICD-10-CM | POA: Diagnosis not present

## 2023-08-17 DIAGNOSIS — H61001 Unspecified perichondritis of right external ear: Secondary | ICD-10-CM

## 2023-08-17 DIAGNOSIS — L821 Other seborrheic keratosis: Secondary | ICD-10-CM

## 2023-08-17 DIAGNOSIS — L578 Other skin changes due to chronic exposure to nonionizing radiation: Secondary | ICD-10-CM

## 2023-08-17 DIAGNOSIS — W908XXA Exposure to other nonionizing radiation, initial encounter: Secondary | ICD-10-CM

## 2023-08-17 DIAGNOSIS — L820 Inflamed seborrheic keratosis: Secondary | ICD-10-CM

## 2023-08-17 DIAGNOSIS — D1801 Hemangioma of skin and subcutaneous tissue: Secondary | ICD-10-CM

## 2023-08-17 NOTE — Patient Instructions (Addendum)
Hello Barbara Lopez,  Thank you for visiting [place] again today. We appreciate your dedication to maintaining your skin health and are here to support you in this journey.  Here is a summary of the key instructions from today's office visit:  - Skin Examination: We conducted a thorough examination from your scalp down, focusing on any new or existing spots.  - Treatment of Skin Lesions:   - The seborrheic keratosis on your left temple was treated by freezing.   - A biopsy was performed on the tender spot on your ear to rule out chondrodermatitis nodularis helicis [facility]) or precancer.  - Post-Treatment Care:   - Keep the biopsy area clean using soap and water.   - Optional: For additional protection, you may apply Aquaphor and cover with a band-aid.  - Medication and Supplements:   - We discussed the limited effectiveness of Biotin for post-menopausal hair thinning; a well-balanced diet is recommended instead.   - We mentioned the option of using topical minoxidil for hair thinning, if desired.  - Follow-Up: We advise regular annual check-ups, or sooner if any suspicious changes occur.  Please feel free to reach out if you have any questions or concerns about your treatment or if you notice any changes in your skin condition.  Warm regards,  Dr. [facility]    Patient Handout: Wound Care for Skin Biopsy Site  Taking Care of Your Skin Biopsy Site  Proper care of the biopsy site is essential for promoting healing and minimizing scarring. This handout provides instructions on how to care for your biopsy site to ensure optimal recovery.  1. Cleaning the Wound:  Clean the biopsy site daily with gentle soap and water. Gently pat the area dry with a clean, soft towel. Avoid harsh scrubbing or rubbing the area, as this can irritate the skin and delay healing.  2. Applying Aquaphor and Bandage:  After cleaning the wound, apply a thin layer of Aquaphor ointment to the  biopsy site. Cover the area with a sterile bandage to protect it from dirt, bacteria, and friction. Change the bandage daily or as needed if it becomes soiled or wet.  3. Continued Care for One Week:  Repeat the cleaning, Aquaphor application, and bandaging process daily for one week following the biopsy procedure. Keeping the wound clean and moist during this initial healing period will help prevent infection and promote optimal healing.  4. Massaging Aquaphor into the Area:  ---After one week, discontinue the use of bandages but continue to apply Aquaphor to the biopsy site. ----Gently massage the Aquaphor into the area using circular motions. ---Massaging the skin helps to promote circulation and prevent the formation of scar tissue.   Additional Tips:  Avoid exposing the biopsy site to direct sunlight during the healing process, as this can cause hyperpigmentation or worsen scarring. If you experience any signs of infection, such as increased redness, swelling, warmth, or drainage from the wound, contact your healthcare provider immediately. Follow any additional instructions provided by your healthcare provider for caring for the biopsy site and managing any discomfort. Conclusion:  Taking proper care of your skin biopsy site is crucial for ensuring optimal healing and minimizing scarring. By following these instructions for cleaning, applying Aquaphor, and massaging the area, you can promote a smooth and successful recovery. If you have any questions or concerns about caring for your biopsy site, don't hesitate to contact your healthcare provider for guidance.    Important Information   Due  to recent changes in healthcare laws, you may see results of your pathology and/or laboratory studies on MyChart before the doctors have had a chance to review them. We understand that in some cases there may be results that are confusing or concerning to you. Please understand that not all  results are received at the same time and often the doctors may need to interpret multiple results in order to provide you with the best plan of care or course of treatment. Therefore, we ask that you please give [place] 2 business days to thoroughly review all your results before contacting the office for clarification. Should we see a critical lab result, you will be contacted sooner.     If You Need Anything After Your Visit   If you have any questions or concerns for your doctor, please call our main line at [phone]. If no one answers, please leave a voicemail as directed and we will return your call as soon as possible. Messages left after 4 pm will be answered the following business day.    You may also send [place] a message via MyChart. We typically respond to MyChart messages within 1-2 business days.  For prescription refills, please ask your pharmacy to contact our office. Our fax number is [phone].  If you have an urgent issue when the clinic is closed that cannot wait until the next business day, you can page your doctor at the number below.     Please note that while we do our best to be available for urgent issues outside of office hours, we are not available 24/7.    If you have an urgent issue and are unable to reach [place], you may choose to seek medical care at your doctor's office, retail clinic, urgent care center, or emergency room.   If you have a medical emergency, please immediately call 911 or go to the emergency department. In the event of inclement weather, please call our main line at [phone] for an update on the status of any delays or closures.  Dermatology Medication Tips: Please keep the boxes that topical medications come in in order to help keep track of the instructions about where and how to use these. Pharmacies typically print the medication instructions only on the boxes and not directly on the medication tubes.   If your medication is too expensive,  please contact our office at [phone] or send [place] a message through MyChart.    We are unable to tell what your co-pay for medications will be in advance as this is different depending on your insurance coverage. However, we may be able to find a substitute medication at lower cost or fill out paperwork to get insurance to cover a needed medication.    If a prior authorization is required to get your medication covered by your insurance company, please allow [place] 1-2 business days to complete this process.   Drug prices often vary depending on where the prescription is filled and some pharmacies may offer cheaper prices.   The website www.goodrx.com contains coupons for medications through different pharmacies. The prices here do not account for what the cost may be with help from insurance (it may be cheaper with your insurance), but the website can give you the price if you did not use any insurance.  - You can print the associated coupon and take it with your prescription to the pharmacy.  - You may also stop by our office during regular business hours and pick  up a GoodRx coupon card.  - If you need your prescription sent electronically to a different pharmacy, notify our office through [facility] or by phone at [phone]

## 2023-08-17 NOTE — Progress Notes (Signed)
Total Body Skin Exam (TBSE) Visit   Subjective  Barbara Lopez is a 88 y.o. female accompanied by oldest daughter Lupita Leash) who presents for the following: Skin Cancer Screening and Full Body Skin Exam  Patient presents today for follow up visit for TBSE. Patient was last evaluated on 06/04/23 . Patient denies medication changes. Patient reports she does not have spots, moles and lesions of concern to be evaluated. Patient reports throughout her lifetime she has had minimal sun exposure. Currently, patient reports if she has excessive sun exposure, she does not apply sunscreen and/or wears protective coverings. Patient reports she has hx of bx. Patient denies  family history of skin cancers. The patient has spots, moles and lesions to be evaluated, some may be new or changing and the patient has concerns that these could be cancer.  The following portions of the chart were reviewed this encounter and updated as appropriate: medications, allergies, medical history  Review of Systems:  No other skin or systemic complaints except as noted in HPI or Assessment and Plan.  Objective  Well appearing patient in no apparent distress; mood and affect are within normal limits.  A full examination was performed including scalp, head, eyes, ears, nose, lips, neck, chest, axillae, abdomen, back, buttocks, bilateral upper extremities, bilateral lower extremities, hands, feet, fingers, toes, fingernails, and toenails. All findings within normal limits unless otherwise noted below.   Relevant physical exam findings are noted in the Assessment and Plan.        Assessment & Plan   LENTIGINES, SEBORRHEIC KERATOSES, HEMANGIOMAS - Benign normal skin lesions - Benign-appearing - Call for any changes  MELANOCYTIC NEVI - Tan-brown and/or pink-flesh-colored symmetric macules and papules - Benign appearing on exam today - Observation - Call clinic for new or changing moles - Recommend daily use of broad  spectrum spf 30+ sunscreen to sun-exposed areas.   ACTINIC DAMAGE - Chronic condition, secondary to cumulative UV/sun exposure - diffuse scaly erythematous macules with underlying dyspigmentation - Recommend daily broad spectrum sunscreen SPF 30+ to sun-exposed areas, reapply every 2 hours as needed.  - Staying in the shade or wearing long sleeves, sun glasses (UVA+UVB protection) and wide brim hats (4-inch brim around the entire circumference of the hat) are also recommended for sun protection.  - Call for new or changing lesions.  Inflamed seborrheic keratosis Left Temple  Symptomatic, irritating, patient would like treated.  Benign-appearing.  Call clinic for new or changing lesions.    Destruction of lesion - Left Temple Complexity: simple   Destruction method: cryotherapy   Informed consent: discussed and consent obtained   Timeout:  patient name, date of birth, surgical site, and procedure verified Lesion destroyed using liquid nitrogen: Yes   Post-procedure details: wound care instructions given    Neoplasm of uncertain behavior of skin Right Ear  Skin / nail biopsy Type of biopsy: tangential   Informed consent: discussed and consent obtained   Timeout: patient name, date of birth, surgical site, and procedure verified   Procedure prep:  Patient was prepped and draped in usual sterile fashion Prep type:  Isopropyl alcohol Anesthesia: the lesion was anesthetized in a standard fashion   Anesthetic:  1% lidocaine w/ epinephrine 1-100,000 buffered w/ 8.4% NaHCO3 Instrument used: DermaBlade   Hemostasis achieved with: aluminum chloride   Outcome: patient tolerated procedure well   Post-procedure details: sterile dressing applied and wound care instructions given   Dressing type: petrolatum gauze and bandage    Specimen 1 -  Surgical pathology Differential Diagnosis: [facility] vs ISK  Check Margins: No  SKIN CANCER SCREENING PERFORMED TODAY. Return in about 1 year (around  08/16/2024) for TBSE.  Documentation: I have reviewed the above documentation for accuracy and completeness, and I agree with the above.  Stasia Cavalier, am acting as scribe for Langston Reusing, DO.  Langston Reusing, DO

## 2023-08-20 ENCOUNTER — Ambulatory Visit (INDEPENDENT_AMBULATORY_CARE_PROVIDER_SITE_OTHER): Payer: Medicare Other | Admitting: Adult Health

## 2023-08-20 ENCOUNTER — Encounter: Payer: Self-pay | Admitting: Adult Health

## 2023-08-20 VITALS — BP 122/78 | HR 91 | Temp 97.5°F | Resp 18 | Ht 62.0 in | Wt 158.6 lb

## 2023-08-20 DIAGNOSIS — E039 Hypothyroidism, unspecified: Secondary | ICD-10-CM

## 2023-08-20 DIAGNOSIS — J449 Chronic obstructive pulmonary disease, unspecified: Secondary | ICD-10-CM | POA: Diagnosis not present

## 2023-08-20 DIAGNOSIS — E785 Hyperlipidemia, unspecified: Secondary | ICD-10-CM

## 2023-08-20 DIAGNOSIS — F339 Major depressive disorder, recurrent, unspecified: Secondary | ICD-10-CM | POA: Diagnosis not present

## 2023-08-20 DIAGNOSIS — R7303 Prediabetes: Secondary | ICD-10-CM

## 2023-08-20 LAB — SURGICAL PATHOLOGY

## 2023-08-20 NOTE — Progress Notes (Signed)
[facility] clinic  Provider:  Kenard Gower DNP  Code Status:  DNR  Goals of Care:     05/21/2023    2:42 PM  Advanced Directives  Does Patient Have a Medical Advance Directive? Yes  Type of Estate agent of [place];Living will;Out of facility DNR (pink MOST or yellow form)  Does patient want to make changes to medical advance directive? No - Patient declined  Copy of Healthcare Power of Attorney in Chart? No - copy requested     Chief Complaint  Patient presents with   Medical Management of Chronic Issues    6 month follow-up    Immunizations    Shingrix, Covid, and Influenza   Health Maintenance    Foot and Eye Exam, Hemoglobin A1c    HPI: Patient is a 88 y.o. female seen today for a 6 month follow up of chronic medical issues.  She was accompanied today by her daughter.   Had biopsy of a skin lesion on right ear upper lobe 3 days ago.  Chronic obstructive pulmonary disease, unspecified COPD type (HCC) -  has wheezing episode 2X/week, with exertion and anxiety provoking situations, takes albuterol PRN and Stiolto  Acquired hypothyroidism -  tsh 0.99, takes Levothyroxine, takes 2 naps/day  Major depression, recurrent, chronic (HCC) -  denies depression, takes venlafaxine  Hyperlipidemia LDL goal <100 -  chol 169, trg 88, LDL 100, not on statin  Prediaetes -  A1C 6.3, not on medications  GERD -  has heartburns at night when she eats late, takes Protonix   Wt Readings from Last 3 Encounters:  08/20/23 158 lb 9.6 oz (71.9 kg)  05/21/23 148 lb (67.1 kg)  05/12/23 150 lb (68 kg)      Past Medical History:  Diagnosis Date   Anxiety    Asthma    Bell's palsy    Hearing loss bilateral   Hypothyroidism    Left wrist fracture    Macular degeneration, wet (HCC) nxiety   Non Hodgkin's lymphoma (HCC)    Osteoarthritis     Past Surgical History:  Procedure Laterality Date   APPENDECTOMY     CATARACT EXTRACTION     CHOLECYSTECTOMY      TONSILLECTOMY     TUBAL LIGATION      Allergies  Allergen Reactions   Ace Inhibitors Other (See Comments)   Iodinated Contrast Media    Iodine     Iodine Contrast   Other Rash    Outpatient Encounter Medications as of 08/20/2023  Medication Sig   albuterol (VENTOLIN HFA) 108 (90 Base) MCG/ACT inhaler INHALE TWO PUFFS BY MOUTH DAILY AS NEEDED FOR WHEEZING OR SHORTNESS OF BREATH **EMERGENCY USE**   Calcium Carb-Cholecalciferol (CALCIUM/VITAMIN D PO) Take 2 tablets by mouth daily.   ezetimibe (ZETIA) 10 MG tablet TAKE 1 TABLET BY MOUTH DAILY   hydrOXYzine (ATARAX) 10 MG tablet TAKE 1 TABLET BY MOUTH DAILY AS NEEDED   levothyroxine (SYNTHROID) 75 MCG tablet TAKE 1 TABLET BY MOUTH DAILY   meloxicam (MOBIC) 15 MG tablet TAKE 1 TABLET BY MOUTH DAILY   Multiple Vitamins-Minerals (PRESERVISION AREDS PO) Take by mouth in the morning and at bedtime.   Nutritional Supplements (FRUIT & VEGETABLE DAILY) CAPS Take by mouth. 2 fruit and 2 Veggie supplements daily (Balance of Nature)   pantoprazole (PROTONIX) 40 MG tablet Take 1 tablet (40 mg total) by mouth daily.   STIOLTO RESPIMAT 2.5-2.5 MCG/ACT AERS INHALE 2 INHALATION(S) BY MOUTH DAILY   venlafaxine (EFFEXOR)  75 MG tablet Take 1 tablet (75 mg total) by mouth 2 (two) times daily.   BIOTIN MAXIMUM PO Take 1 tablet by mouth daily. (Patient not taking: Reported on 08/20/2023)   No facility-administered encounter medications on file as of 08/20/2023.    Review of Systems:  Review of Systems  Constitutional:  Negative for appetite change, chills, fatigue and fever.  HENT:  Negative for congestion, hearing loss, rhinorrhea and sore throat.   Eyes: Negative.   Respiratory:  Positive for wheezing. Negative for cough and shortness of breath.   Cardiovascular:  Negative for chest pain, palpitations and leg swelling.  Gastrointestinal:  Negative for abdominal pain, constipation, diarrhea, nausea and vomiting.  Genitourinary:  Negative for dysuria.   Musculoskeletal:  Negative for arthralgias, back pain and myalgias.  Skin:  Negative for color change, rash and wound.  Neurological:  Negative for dizziness, weakness and headaches.  Psychiatric/Behavioral:  Negative for behavioral problems. The patient is not nervous/anxious.     Health Maintenance  Topic Date Due   FOOT EXAM  Never done   OPHTHALMOLOGY EXAM  Never done   Zoster Vaccines- Shingrix (1 of 2) 02/27/1954   INFLUENZA VACCINE  04/09/2023   COVID-19 Vaccine (4 - 2024-25 season) 05/10/2023   HEMOGLOBIN A1C  08/18/2023   Medicare Annual Wellness (AWV)  09/24/2023   DTaP/Tdap/Td (2 - Td or Tdap) 12/27/2026   Pneumonia Vaccine 15+ Years old  Completed   DEXA SCAN  Completed   HPV VACCINES  Aged Out    Physical Exam: Vitals:   08/20/23 1421  BP: 122/78  Pulse: 91  Resp: 18  Temp: (!) 97.5 F (36.4 C)  SpO2: 96%  Weight: 158 lb 9.6 oz (71.9 kg)  Height: 5\' 2"  (1.575 m)   Body mass index is 29.01 kg/m. Physical Exam Constitutional:      General: She is not in acute distress.    Appearance: Normal appearance.  HENT:     Head: Normocephalic and atraumatic.     Nose: Nose normal.     Mouth/Throat:     Mouth: Mucous membranes are moist.  Eyes:     Conjunctiva/sclera: Conjunctivae normal.  Cardiovascular:     Rate and Rhythm: Normal rate and regular rhythm.  Pulmonary:     Effort: Pulmonary effort is normal.     Breath sounds: Normal breath sounds.  Abdominal:     General: Bowel sounds are normal.     Palpations: Abdomen is soft.  Musculoskeletal:        General: Normal range of motion.     Cervical back: Normal range of motion.  Skin:    General: Skin is warm and dry.  Neurological:     General: No focal deficit present.     Mental Status: She is alert and oriented to person, place, and time.  Psychiatric:        Mood and Affect: Mood normal.        Behavior: Behavior normal.     Labs reviewed: Basic Metabolic Panel: Recent Labs     10/14/22 1545 02/16/23 1548  NA  --  140  K  --  4.3  CL  --  104  CO2  --  27  GLUCOSE  --  108  BUN  --  18  CREATININE  --  0.84  CALCIUM  --  9.9  TSH 0.99  --    Liver Function Tests: Recent Labs    02/16/23 1548  AST 14  ALT  14  BILITOT 0.5  PROT 6.4   No results for input(s): "LIPASE", "AMYLASE" in the last 8760 hours. No results for input(s): "AMMONIA" in the last 8760 hours. CBC: Recent Labs    02/16/23 1548  WBC 6.8  NEUTROABS 3,026  HGB 13.7  HCT 41.6  MCV 91.6  PLT 286   Lipid Panel: Recent Labs    02/16/23 1548  CHOL 169  HDL 51  LDLCALC 100*  TRIG 88  CHOLHDL 3.3   Lab Results  Component Value Date   HGBA1C 6.3 (H) 02/16/2023    Procedures since last visit: No results found.  Assessment/Plan  1. Chronic obstructive pulmonary disease, unspecified COPD type (HCC) (Primary) -Stable -Continue Stiolto Respimat and albuterol HFA as needed  2. Acquired hypothyroidism -Continue levothyroxine - TSH  3. Major depression, recurrent, chronic (HCC) -Denies depression -  Continue venlafaxine. - Complete Metabolic Panel with eGFR - CBC with Differential/Platelets  4. Hyperlipidemia LDL goal <100 Lab Results  Component Value Date   CHOL 169 02/16/2023   HDL 51 02/16/2023   LDLCALC 100 (H) 02/16/2023   TRIG 88 02/16/2023   CHOLHDL 3.3 02/16/2023    -Continue Zetia   5. Prediabetes -   Diet controlled - Hemoglobin A1C - Complete Metabolic Panel with eGFR     Labs/tests ordered:  - Hemoglobin A1C - Complete Metabolic Panel with eGFR -TSH -CBC  Next appt:  09/29/2023

## 2023-08-21 ENCOUNTER — Ambulatory Visit: Payer: Medicare Other | Admitting: Adult Health

## 2023-08-21 LAB — CBC WITH DIFFERENTIAL/PLATELET
Absolute Lymphocytes: 2877 {cells}/uL (ref 850–3900)
Absolute Monocytes: 504 {cells}/uL (ref 200–950)
Basophils Absolute: 41 {cells}/uL (ref 0–200)
Basophils Relative: 0.6 %
Eosinophils Absolute: 200 {cells}/uL (ref 15–500)
Eosinophils Relative: 2.9 %
HCT: 42.2 % (ref 35.0–45.0)
Hemoglobin: 13.8 g/dL (ref 11.7–15.5)
MCH: 30.1 pg (ref 27.0–33.0)
MCHC: 32.7 g/dL (ref 32.0–36.0)
MCV: 91.9 fL (ref 80.0–100.0)
MPV: 9.3 fL (ref 7.5–12.5)
Monocytes Relative: 7.3 %
Neutro Abs: 3278 {cells}/uL (ref 1500–7800)
Neutrophils Relative %: 47.5 %
Platelets: 285 10*3/uL (ref 140–400)
RBC: 4.59 10*6/uL (ref 3.80–5.10)
RDW: 14.2 % (ref 11.0–15.0)
Total Lymphocyte: 41.7 %
WBC: 6.9 10*3/uL (ref 3.8–10.8)

## 2023-08-21 LAB — COMPLETE METABOLIC PANEL WITH GFR
AG Ratio: 1.7 (calc) (ref 1.0–2.5)
ALT: 13 U/L (ref 6–29)
AST: 14 U/L (ref 10–35)
Albumin: 4.4 g/dL (ref 3.6–5.1)
Alkaline phosphatase (APISO): 71 U/L (ref 37–153)
BUN: 19 mg/dL (ref 7–25)
CO2: 30 mmol/L (ref 20–32)
Calcium: 9.8 mg/dL (ref 8.6–10.4)
Chloride: 102 mmol/L (ref 98–110)
Creat: 0.74 mg/dL (ref 0.60–0.95)
Globulin: 2.6 g/dL (ref 1.9–3.7)
Glucose, Bld: 93 mg/dL (ref 65–139)
Potassium: 5.1 mmol/L (ref 3.5–5.3)
Sodium: 139 mmol/L (ref 135–146)
Total Bilirubin: 0.5 mg/dL (ref 0.2–1.2)
Total Protein: 7 g/dL (ref 6.1–8.1)
eGFR: 79 mL/min/{1.73_m2} (ref >=60)

## 2023-08-21 LAB — HEMOGLOBIN A1C
Hgb A1c MFr Bld: 6.3 %{Hb} — ABNORMAL HIGH (ref <5.7)
Mean Plasma Glucose: 134 mg/dL
eAG (mmol/L): 7.4 mmol/L

## 2023-08-21 LAB — TSH: TSH: 2.86 m[IU]/L (ref 0.40–4.50)

## 2023-08-25 NOTE — Progress Notes (Signed)
-     A1c same as 6 months ago, ranging as prediabetic -   TSH normal -   No anemia -   Liver enzymes, electrolytes normal

## 2023-09-12 ENCOUNTER — Encounter: Payer: Self-pay | Admitting: Nurse Practitioner

## 2023-09-12 DIAGNOSIS — M170 Bilateral primary osteoarthritis of knee: Secondary | ICD-10-CM

## 2023-09-29 ENCOUNTER — Encounter: Payer: Medicare Other | Admitting: Nurse Practitioner

## 2023-10-01 ENCOUNTER — Ambulatory Visit (INDEPENDENT_AMBULATORY_CARE_PROVIDER_SITE_OTHER): Payer: Medicare Other | Admitting: Nurse Practitioner

## 2023-10-01 ENCOUNTER — Ambulatory Visit: Payer: Medicare Other | Admitting: Rehabilitative and Restorative Service Providers"

## 2023-10-01 ENCOUNTER — Encounter: Payer: Self-pay | Admitting: Nurse Practitioner

## 2023-10-01 ENCOUNTER — Encounter: Payer: Self-pay | Admitting: Rehabilitative and Restorative Service Providers"

## 2023-10-01 DIAGNOSIS — M25661 Stiffness of right knee, not elsewhere classified: Secondary | ICD-10-CM | POA: Diagnosis not present

## 2023-10-01 DIAGNOSIS — R2689 Other abnormalities of gait and mobility: Secondary | ICD-10-CM

## 2023-10-01 DIAGNOSIS — M25561 Pain in right knee: Secondary | ICD-10-CM | POA: Diagnosis not present

## 2023-10-01 DIAGNOSIS — M6281 Muscle weakness (generalized): Secondary | ICD-10-CM | POA: Diagnosis not present

## 2023-10-01 DIAGNOSIS — E2839 Other primary ovarian failure: Secondary | ICD-10-CM | POA: Diagnosis not present

## 2023-10-01 DIAGNOSIS — Z0000 Encounter for general adult medical examination without abnormal findings: Secondary | ICD-10-CM | POA: Diagnosis not present

## 2023-10-01 DIAGNOSIS — G8929 Other chronic pain: Secondary | ICD-10-CM

## 2023-10-01 NOTE — Therapy (Addendum)
 OUTPATIENT PHYSICAL THERAPY LOWER EXTREMITY EVALUATION / DISCHARGE   Patient Name: Barbara Lopez MRN: [id number] DOB:11-04-1934, 89 y.o., female Today's Date: 10/01/2023  END OF SESSION:  PT End of Session - 10/01/23 1438     Visit Number 1    Number of Visits 20    Date for PT Re-Evaluation 12/03/23    Authorization Type Medicare and BCBS    Authorization - Visit Number --    Authorization - Number of Visits --    Progress Note Due on Visit 10    PT Start Time 1439    PT Stop Time 1520    PT Time Calculation (min) 41 min    Activity Tolerance Patient tolerated treatment well;Patient limited by pain    Behavior During Therapy WFL for tasks assessed/performed             Past Medical History:  Diagnosis Date   Anxiety    Asthma    Bell's palsy    Hearing loss bilateral   Hypothyroidism    Left wrist fracture    Macular degeneration, wet (HCC) nxiety   Non Hodgkin's lymphoma (HCC)    Osteoarthritis    Past Surgical History:  Procedure Laterality Date   APPENDECTOMY     CATARACT EXTRACTION     CHOLECYSTECTOMY     TONSILLECTOMY     TUBAL LIGATION     Patient Active Problem List   Diagnosis Date Noted   Osteopenia 04/05/2021   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) 10/05/2020   Snoring 10/05/2020   Hearing loss 10/05/2020   Bilateral primary osteoarthritis of knee 10/05/2020   Primary hypertension 10/05/2020   Hyperlipidemia LDL goal <100 10/05/2020   Acquired hypothyroidism 10/05/2020   Mild intermittent asthma 10/05/2020   Anxiety 10/05/2020   Unilateral primary osteoarthritis, left knee 08/21/2020   Unilateral primary osteoarthritis, right knee 08/21/2020   Allergic rhinitis 03/12/2020   Age-related osteoporosis without current pathological fracture 03/12/2020   History of non-Hodgkin's lymphoma 03/12/2020   Major depressive disorder, single episode, unspecified 03/12/2020   Obesity, unspecified 03/12/2020   Type 2 diabetes mellitus with  circulatory disorder (HCC) 03/12/2020   COPD (chronic obstructive pulmonary disease) (HCC) 09/18/2017   Sinus tachycardia 09/18/2017    PCP: Sharon Seller, NP   REFERRING PROVIDER: Sharon Seller, NP   REFERRING DIAG: Bilateral primary osteoarthritis of knee [M17.0]  THERAPY DIAG:  Chronic pain of right knee  Stiffness of right knee, not elsewhere classified  Muscle weakness (generalized)  Other abnormalities of gait and mobility  Rationale for Evaluation and Treatment: Rehabilitation  ONSET DATE: chronic knee pain for a few years SUBJECTIVE:   SUBJECTIVE STATEMENT: R Knee pain that has been going on for a few years and progressively worsened. Has most pain when walking or putting pressure/weight on it. At home is doing furniture to keep upright. Is able to walk distances such as grocery store without issue. Prior to cold weather, was walking ~24ft to mailbox daily.  Daughter accompanies session this afternoon.   PERTINENT HISTORY: History of falls with injury, anxiety, bell's palsy, hearing loss, macular degeneration, OA, non Hodgkin's lymphoma  PAIN:  NPRS scale: 0/10 at best/rest, /10 Pain location: front of knee/patella area Pain description: achy and sore Aggravating factors: walking on it Relieving factors: rest  PRECAUTIONS: Fall  WEIGHT BEARING RESTRICTIONS: No  FALLS:  Has patient fallen in last 6 months? Yes. Number of falls: >3 1 with head injury. Also fell in the yard, in the house  when bending down to pick something up.  LIVING ENVIRONMENT: Lives with: lives with their family Lives in: House Stairs: everything on 1 level no need to do stairs to get into home- ramps When negotiating curbs in community patient is dependent on other or someone to assist at home Has following equipment at home: Single point cane, Environmental consultant - 2 wheeled, Environmental consultant - 4 wheeled, Ramped entry, and bed railing  OCCUPATION: retired  PLOF: Independent  PATIENT GOALS: to  use her knee more without pain, do household chores, participate in family activities  Next MD visit: not scheduled   OBJECTIVE:   DIAGNOSTIC FINDINGS: no current knee imaging  PATIENT SURVEYS:  FOTO intake: 36   predicted:  58  COGNITION: Overall cognitive status: WFL    SENSATION: Not tested  EDEMA:  R medial knee swelling in 1 localized area, most likely due to inflammation in   POSTURE:  rounded shoulders  PALPATION: Tender at anteriomedial aspect/joint line. Patient has moveable pocket of inflammation.    LOWER EXTREMITY ROM:   ROM Right eval Left eval  Hip flexion    Hip extension    Hip abduction    Hip adduction    Hip internal rotation    Hip external rotation    Knee flexion    Knee extension    Ankle dorsiflexion    Ankle plantarflexion    Ankle inversion    Ankle eversion     (Blank rows = not tested)  LOWER EXTREMITY MMT:  MMT Right eval Left eval  Hip flexion 4+ 4+  Hip extension    Hip abduction    Hip adduction    Hip internal rotation    Hip external rotation    Knee flexion 5 5  Knee extension 3- 4+  Ankle dorsiflexion 3 4+  Ankle plantarflexion    Ankle inversion    Ankle eversion     (Blank rows = not tested)  FUNCTIONAL TESTS:  18 inch chair transfer: required UE on lap for rise to stand TUG: 18sec, decreased cadence when turning around, requires additional time to gain balance before ambulating after stand  GAIT: Distance walked: clinic distance Assistive device utilized: None Level of assistance: Complete Independence Comments: decreased cadence and step length, does not reach terminal knee extension during initial contact or stance.                                                                                                                                                                         TODAY'S TREATMENT  DATE: 10/01/2023 Therex:     HEP instruction/performance c cues for techniques, handout provided.  Trial set performed of each for comprehension and symptom assessment.  See below for exercise list  PATIENT EDUCATION:  Education details: HEP, POC Person educated: Patient Education method: Explanation, Demonstration, Verbal cues, and Handouts Education comprehension: verbalized understanding, returned demonstration, and verbal cues required  HOME EXERCISE PROGRAM: Access Code: [email or web address] URL: [email or web address] Date: 10/01/2023 Prepared by: Chyrel Masson   Exercises - Seated Long Arc Quad  - 2-3 x daily - 7 x weekly - 1-2 sets - 10 reps - 3-5 hold - Supine Quad Set on Towel Roll (Mirrored)  - 2-3 x daily - 7 x weekly - 1-2 sets - 10 reps - 3-5 hold - Seated Hip Abduction with Resistance  - 2-3 x daily - 7 x weekly - 1-2 sets - 10 reps - 3-5 hold has context menu  ASSESSMENT:  CLINICAL IMPRESSION: Patient is a pleasant 89 y.o. who comes to clinic with complaints of chronic R knee pain with mobility, strength and movement coordination deficits that impair their ability to perform usual daily and recreational functional activities without increase difficulty/symptoms at this time.  Patient demonstrated weakness of R LE during MMT and vocalized pain with weightbearing activities. Patient's pain restricts participation in ADL/iADLs, household and community navigation, and family participation.  Patient to benefit from skilled PT services to address impairments and limitations to improve to previous level of function without restriction secondary to condition.   OBJECTIVE IMPAIRMENTS: Abnormal gait, decreased activity tolerance, decreased balance, decreased coordination, decreased endurance, decreased knowledge of condition, decreased knowledge of use of DME, decreased mobility, difficulty walking, decreased ROM, decreased strength, decreased safety awareness, dizziness, hypomobility, increased edema, impaired  vision/preception, and pain.   ACTIVITY LIMITATIONS: carrying, lifting, bending, sitting, standing, squatting, stairs, bed mobility, toileting, and caring for others  PARTICIPATION LIMITATIONS: meal prep, cleaning, laundry, interpersonal relationship, shopping, community activity, and yard work  PERSONAL FACTORS: Age, Time since onset of injury/illness/exacerbation, and Transportation are also affecting patient's functional outcome.   REHAB POTENTIAL: Good  CLINICAL DECISION MAKING: Stable/uncomplicated  EVALUATION COMPLEXITY: Low   GOALS: Goals reviewed with patient? Yes  SHORT TERM GOALS: (target date for Short term goals are 3 weeks 10/22/2023)   1.  Patient will demonstrate independent use of home exercise program to maintain progress from in clinic treatments.  Goal status: New  LONG TERM GOALS: (target dates for all long term goals are 10 weeks  12/03/2023 )   1. Patient will demonstrate/report pain at worst less than or equal to 2/10 to facilitate minimal limitation in daily activity secondary to pain symptoms.  Goal status: New   2. Patient will demonstrate independent use of home exercise program to facilitate ability to maintain/progress functional gains from skilled physical therapy services. Goal status: New   3. Patient will demonstrate FOTO outcome > or = 53 % to indicate reduced disability due to condition. Goal status: New   4.  Patient will demonstrate R LE MMT equal to L LE throughout to faciltiate usual transfers, stairs, squatting at [facility] for daily life.  Goal status: New   5.  Patient will report ambulation of >/=342ft in community with less than 2/10 pain scale score Goal status: New  6. Patient will score <14sec for TUG for reduction of fall risk Goal status: New   PLAN:  PT FREQUENCY: 1-2x/week  PT DURATION: 10 weeks  PLANNED INTERVENTIONS: Can include 11914- PT Re-evaluation, 97110-Therapeutic exercises, 97530- Therapeutic activity,  16109-  Neuromuscular re-education, [phone]- Self Care, 09811- Manual therapy, [id number]- Gait training, [phone]- Orthotic Fit/training, [id number]- Canalith repositioning, [id number]- Aquatic Therapy, 97014- Electrical stimulation (unattended), [id number]- Electrical stimulation (manual), [id number] Physical performance testing, 97016- Vasopneumatic device, [id number]- Ultrasound, [id number]- Traction (mechanical), [id number]- Ionotophoresis 4mg /ml Dexamethasone, Patient/Family education, Balance training, Stair training, Taping, Dry Needling, Joint mobilization, Joint manipulation, Spinal manipulation, Spinal mobilization, Scar mobilization, Vestibular training, Visual/preceptual remediation/compensation, DME instructions, Cryotherapy, and Moist heat.  All performed as medically necessary.  All included unless contraindicated  PLAN FOR NEXT SESSION: Review HEP knowledge/results, progress activity, address balance   An Schnabel, Valleri Hendricksen, Student-PT 10/01/2023, 4:32 PM    PHYSICAL THERAPY DISCHARGE SUMMARY  Visits from Start of Care: 1  Current functional level related to goals / functional outcomes: See note   Remaining deficits: See note   Education / Equipment: HEP  Patient goals were not met. Patient is being discharged due to not returning since the last visit.  Chyrel Masson, PT, DPT, OCS, ATC 11/03/23  7:57 AM

## 2023-10-01 NOTE — Progress Notes (Signed)
This service is provided via telemedicine  No vital signs collected/recorded due to the encounter was a telemedicine visit.   Location of patient (ex: home, work):  Home  Patient consents to a telephone visit:  Yes  Location of the provider (ex: office, home):  Office [place].   Name of any referring provider:  na  Names of all persons participating in the telemedicine service and their role in the encounter:  Shabnam Steinle, Patient Theada Poeppelman, daughter, Synetta Fail Tallis Soledad, CMA, Abbey Chatters, NP  Time spent on call:  7:12

## 2023-10-01 NOTE — Progress Notes (Signed)
Subjective:   Barbara Lopez is a 89 y.o. female who presents for Medicare Annual (Subsequent) preventive examination.  Visit Complete: Virtual I connected with  [name] on 10/01/23 by a video and audio enabled telemedicine application and verified that I am speaking with the correct person using two identifiers.  Patient Location: Home  Provider Location: Office/Clinic  I discussed the limitations of evaluation and management by telemedicine. The patient expressed understanding and agreed to proceed.  Vital Signs: Because this visit was a virtual/telehealth visit, some criteria may be missing or patient reported. Any vitals not documented were not able to be obtained and vitals that have been documented are patient reported.      Objective:    There were no vitals filed for this visit. There is no height or weight on file to calculate BMI.     10/01/2023   11:04 AM 05/21/2023    2:42 PM 05/12/2023    8:37 AM 02/16/2023    3:21 PM 01/30/2023    3:36 PM 09/23/2022    8:53 AM 02/07/2022    9:26 AM  Advanced Directives  Does Patient Have a Medical Advance Directive? Yes Yes Yes Yes Yes Yes Yes  Type of Estate agent of [place];Out of facility DNR (pink MOST or yellow form);Living will Healthcare Power of [place];Living will;Out of facility DNR (pink MOST or yellow form) Healthcare Power of [place];Living will Out of facility DNR (pink MOST or yellow form) Out of facility DNR (pink MOST or yellow form) Out of facility DNR (pink MOST or yellow form) Out of facility DNR (pink MOST or yellow form)  Does patient want to make changes to medical advance directive? No - Patient declined No - Patient declined No - Patient declined No - Patient declined No - Patient declined No - Patient declined No - Patient declined  Copy of Healthcare Power of Attorney in Chart? No - copy requested No - copy requested No - copy requested      Pre-existing out of facility DNR order  (yellow form or pink MOST form)    Pink MOST form placed in chart (order not valid for inpatient use) Pink MOST form placed in chart (order not valid for inpatient use) Pink MOST form placed in chart (order not valid for inpatient use) Pink MOST form placed in chart (order not valid for inpatient use)    Current Medications (verified) Outpatient Encounter Medications as of 10/01/2023  Medication Sig   albuterol (VENTOLIN HFA) 108 (90 Base) MCG/ACT inhaler INHALE TWO PUFFS BY MOUTH DAILY AS NEEDED FOR WHEEZING OR SHORTNESS OF BREATH **EMERGENCY USE**   Calcium Carb-Cholecalciferol (CALCIUM/VITAMIN D PO) Take 2 tablets by mouth daily.   ezetimibe (ZETIA) 10 MG tablet TAKE 1 TABLET BY MOUTH DAILY   hydrOXYzine (ATARAX) 10 MG tablet TAKE 1 TABLET BY MOUTH DAILY AS NEEDED   levothyroxine (SYNTHROID) 75 MCG tablet TAKE 1 TABLET BY MOUTH DAILY   meloxicam (MOBIC) 15 MG tablet TAKE 1 TABLET BY MOUTH DAILY   Multiple Vitamins-Minerals (PRESERVISION AREDS PO) Take by mouth in the morning and at bedtime.   Nutritional Supplements (FRUIT & VEGETABLE DAILY) CAPS Take by mouth. 2 fruit and 2 Veggie supplements daily (Balance of Nature)   pantoprazole (PROTONIX) 40 MG tablet Take 1 tablet (40 mg total) by mouth daily.   STIOLTO RESPIMAT 2.5-2.5 MCG/ACT AERS INHALE 2 INHALATION(S) BY MOUTH DAILY   venlafaxine (EFFEXOR) 75 MG tablet Take 1 tablet (75 mg total) by mouth  2 (two) times daily.   No facility-administered encounter medications on file as of 10/01/2023.    Allergies (verified) Ace inhibitors, Iodinated contrast media, Iodine, and Other   History: Past Medical History:  Diagnosis Date   Anxiety    Asthma    Bell's palsy    Hearing loss bilateral   Hypothyroidism    Left wrist fracture    Macular degeneration, wet (HCC) nxiety   Non Hodgkin's lymphoma (HCC)    Osteoarthritis    Past Surgical History:  Procedure Laterality Date   APPENDECTOMY     CATARACT EXTRACTION     CHOLECYSTECTOMY      TONSILLECTOMY     TUBAL LIGATION     Family History  Problem Relation Age of Onset   Throat cancer Mother    Heart attack Father    Lung cancer Sister    Dementia Sister    COPD Sister    Obesity Daughter    Pulmonary embolism Daughter    Breast cancer Daughter    Endometriosis Daughter    Hashimoto's thyroiditis Daughter    Social History   Socioeconomic History   Marital status: Widowed    Spouse name: Not on file   Number of children: Not on file   Years of education: Not on file   Highest education level: Not on file  Occupational History   Not on file  Tobacco Use   Smoking status: Never   Smokeless tobacco: Never  Vaping Use   Vaping status: Never Used  Substance and Sexual Activity   Alcohol use: Never   Drug use: Never   Sexual activity: Not Currently  Other Topics Concern   Not on file  Social History Narrative   Diet: Regular      Do you drink/ eat things with caffeine? Coffee      Marital status:   Widowed                             What year were you married ? 1957      Do you live in a house, apartment,assistred living, condo, trailer, etc.)?  House      Is it one or more stories? 2 Stories      How many persons live in your home ? 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  House Work and Walk to [employer]                            Type & how often  Daily      ADVANCED DIRECTIVES (Please bring copies)      Do you have a living will? No      Do you have a DNR form? No                      If not, do you want to discuss one? Yes      Do you have signed POA?HPOA forms?   No              If so, please bring to your appointment      FUNCTIONAL STATUS- To be  completed by Spouse / child / Staff       Do you have difficulty bathing or dressing yourself ?  No      Do you have difficulty preparing  food or eating ?  No      Do you have difficulty managing your mediation ?  No      Do you have difficulty managing your finances ?  No      Do you have difficulty affording your medication ?  No      Social Drivers of Corporate investment banker Strain: Low Risk  (04/13/2020)   [facility]   Overall Financial Resource Strain (CARDIA)    Difficulty of Paying Living Expenses: Not hard at all  Food Insecurity: No Food Insecurity (04/13/2020)   [facility]   Hunger Vital Sign    Worried About Running Out of Food in the Last Year: Never true    Ran Out of Food in the Last Year: Never true  Transportation Needs: No Transportation Needs (04/13/2020)   [facility]   PRAPARE - Administrator, Civil Service (Medical): No    Lack of Transportation (Non-Medical): No  Physical Activity: Not on file  Stress: Not on file  Social Connections: Unknown (04/13/2020)   [facility]   Social Connection and Isolation Panel [NHANES]    Frequency of Communication with Friends and Family: More than three times a week    Frequency of Social Gatherings with Friends and Family: More than three times a week    Attends Religious Services: Not on file    Active Member of Clubs or Organizations: Not on file    Attends Banker Meetings: Not on file    Marital Status: Widowed    Tobacco Counseling Counseling given: Not Answered   Clinical Intake:     Pain : No/denies pain     BMI - recorded: 29 Nutritional Status: BMI 25 -29 Overweight            Activities of Daily Living    10/01/2023   11:21 AM  In your present state of health, do you have any difficulty performing the following activities:   Hearing? 1  Vision? 1  Difficulty concentrating or making decisions? 1  Walking or climbing stairs? 1  Comment using walker now  Dressing or bathing? 0  Doing errands, shopping? 1  Preparing Food and eating ? N  Comment does not prepare foods  Using the Toilet? N  In the past six months, have you accidently leaked urine? Y  Do you have problems with loss of bowel control? Y  Managing your Medications? N  Managing your Finances? Y  Housekeeping or managing your Housekeeping? Y    Patient Care Team: Sharon Seller, NP as PCP - General (Geriatric Medicine) Barbara Berger, MD as Consulting Physician (Ophthalmology)  Indicate any recent Medical Services you may have received from other than [facility] providers in the past year (date may be approximate).     Assessment:   This is a routine wellness examination for [place].  Hearing/Vision screen Vision Screening - Comments:: [facility] Last Eye Exam: 10/2022   Goals Addressed   None    Depression Screen    10/01/2023  11:05 AM 08/20/2023    2:20 PM 01/30/2023    3:36 PM 09/23/2022    8:58 AM 08/15/2022   10:32 AM 02/07/2022    9:21 AM 09/19/2021    8:20 AM  PHQ 2/9 Scores  PHQ - 2 Score 0 0 0 0 0 0 0  PHQ- 9 Score     0      Fall Risk    10/01/2023   11:05 AM 08/20/2023    2:19 PM 05/21/2023    2:42 PM 02/16/2023    3:21 PM 01/30/2023    3:36 PM  Fall Risk   Falls in the past year? 1 1 1  0 0  Number falls in past yr: 0 1 1  0  Injury with Fall? 0 0 1  0  Risk for fall due to :  History of fall(s) History of fall(s);Impaired balance/gait;Impaired mobility No Fall Risks No Fall Risks  Follow up  Falls evaluation completed Falls evaluation completed;Education provided;Falls prevention discussed Falls evaluation completed Falls evaluation completed    MEDICARE RISK AT HOME: Medicare Risk at Home Any stairs in or around the home?: No Home free of loose throw rugs in walkways, pet beds, electrical cords,  etc?: Yes Adequate lighting in your home to reduce risk of falls?: Yes Life alert?: No Use of a cane, walker or w/c?: Yes Grab bars in the bathroom?: Yes Shower chair or bench in shower?: Yes Elevated toilet seat or a handicapped toilet?: Yes  TIMED UP AND GO:  Was the test performed?  No    Cognitive Function:        10/01/2023   11:07 AM 09/23/2022    9:02 AM 09/19/2021    8:23 AM 09/14/2020    8:34 AM  6CIT Screen  What Year? 0 points 0 points 0 points 0 points  What month? 3 points 0 points 0 points 0 points  What time? 0 points 0 points 0 points 0 points  Count back from 20 0 points 0 points 0 points 0 points  Months in reverse 4 points 4 points 4 points 4 points  Repeat phrase 8 points 4 points 2 points 6 points  Total Score 15 points 8 points 6 points 10 points    Immunizations Immunization History  Administered Date(s) Administered   Fluad Quad(high Dose 65+) 08/01/2020, 06/17/2021   Influenza-Unspecified 06/17/2019, 08/22/2023   Moderna SARS-COV2 Booster Vaccination 09/11/2020   Moderna Sars-Covid-2 Vaccination 11/04/2019, 12/02/2019, 06/07/2022, 08/22/2023   PNEUMOCOCCAL CONJUGATE-20 08/22/2023   Pfizer Covid-19 Vaccine Bivalent Booster 19yrs & up 06/17/2021   Pneumococcal Conjugate-13 07/18/2016   Pneumococcal Polysaccharide-23 04/08/2002   Tdap 12/26/2016   Zoster Recombinant(Shingrix) 07/18/2016, 08/22/2023   Zoster, Live 07/18/2016    TDAP status: Up to date  Flu Vaccine status: Up to date  Pneumococcal vaccine status: Up to date  Covid-19 vaccine status: Information provided on how to obtain vaccines.   Qualifies for Shingles Vaccine? Yes   Zostavax completed No   Shingrix Completed?: Yes  Screening Tests Health Maintenance  Topic Date Due   OPHTHALMOLOGY EXAM  Never done   COVID-19 Vaccine (6 - 2024-25 season) 10/17/2023   HEMOGLOBIN A1C  02/18/2024   FOOT EXAM  08/19/2024   Medicare Annual Wellness (AWV)  09/30/2024   DTaP/Tdap/Td (2 - Td  or Tdap) 12/27/2026   Pneumonia Vaccine 54+ Years old  Completed   INFLUENZA VACCINE  Completed   DEXA SCAN  Completed   Zoster Vaccines- Shingrix  Completed   HPV  VACCINES  Aged Out    Health Maintenance  Health Maintenance Due  Topic Date Due   OPHTHALMOLOGY EXAM  Never done    Colorectal cancer screening: No longer required.   Mammogram status: No longer required due to age.  Bone Density status: Completed 03/12/2021. Results reflect: Bone density results: OSTEOPENIA. Repeat every 2 years.  Lung Cancer Screening: (Low Dose CT Chest recommended if Age 35-80 years, 20 pack-year currently smoking OR have quit w/in 15years.) does not qualify.   Lung Cancer Screening Referral: na  Additional Screening:  Hepatitis C Screening: does not qualify  Vision Screening: Recommended annual ophthalmology exams for early detection of glaucoma and other disorders of the eye. Is the patient up to date with their annual eye exam?  Yes  Who is the provider or what is the name of the office in which the patient attends annual eye exams?  Dr. Charlotte Sanes If pt is not established with a provider, would they like to be referred to a provider to establish care? No .   Dental Screening: Recommended annual dental exams for proper oral hygiene  Diabetic Foot Exam: Diabetic Foot Exam: Completed 08/23/2023  Community Resource Referral / Chronic Care Management: CRR required this visit?  No   CCM required this visit?  No     Plan:     I have personally reviewed and noted the following in the patient's chart:   Medical and social history Use of alcohol, tobacco or illicit drugs  Current medications and supplements including opioid prescriptions. Patient is not currently taking opioid prescriptions. Functional ability and status Nutritional status Physical activity Advanced directives List of other physicians Hospitalizations, surgeries, and ER visits in previous 12 months Vitals Screenings to  include cognitive, depression, and falls Referrals and appointments  In addition, I have reviewed and discussed with patient certain preventive protocols, quality metrics, and best practice recommendations. A written personalized care plan for preventive services as well as general preventive health recommendations were provided to patient.     Sharon Seller, NP   10/01/2023   After Visit Summary: (MyChart) Due to this being a telephonic visit, the after visit summary with patients personalized plan was offered to patient via MyChart

## 2023-10-01 NOTE — Patient Instructions (Signed)
  Ms. Barbara Lopez , Thank you for taking time to come for your Medicare Wellness Visit. I appreciate your ongoing commitment to your health goals. Please review the following plan we discussed and let me know if I can assist you in the future.   When you see your eye doctor please have them send your most up to date eye exam  To call to scheduled bone density [phone]   This is a list of the screening recommended for you and due dates:  Health Maintenance  Topic Date Due   Eye exam for diabetics  Never done   COVID-19 Vaccine (6 - 2024-25 season) 10/17/2023   Hemoglobin A1C  02/18/2024   Complete foot exam   08/19/2024   Medicare Annual Wellness Visit  09/30/2024   DTaP/Tdap/Td vaccine (2 - Td or Tdap) 12/27/2026   Pneumonia Vaccine  Completed   Flu Shot  Completed   DEXA scan (bone density measurement)  Completed   Zoster (Shingles) Vaccine  Completed   HPV Vaccine  Aged Out

## 2023-10-15 ENCOUNTER — Encounter: Payer: Medicare Other | Admitting: Rehabilitative and Restorative Service Providers"

## 2023-10-22 ENCOUNTER — Encounter: Payer: Medicare Other | Admitting: Rehabilitative and Restorative Service Providers"

## 2023-10-28 ENCOUNTER — Other Ambulatory Visit: Payer: Self-pay

## 2023-10-29 ENCOUNTER — Encounter: Payer: Medicare Other | Admitting: Rehabilitative and Restorative Service Providers"

## 2023-10-29 LAB — HM DIABETES EYE EXAM

## 2023-11-05 ENCOUNTER — Encounter: Payer: Medicare Other | Admitting: Rehabilitative and Restorative Service Providers"

## 2023-11-12 ENCOUNTER — Encounter: Payer: Medicare Other | Admitting: Rehabilitative and Restorative Service Providers"

## 2023-11-18 ENCOUNTER — Encounter: Payer: Self-pay | Admitting: Nurse Practitioner

## 2023-11-19 NOTE — Telephone Encounter (Signed)
Message routed to PCP Eubanks, Jessica K, NP  

## 2023-11-25 ENCOUNTER — Other Ambulatory Visit: Payer: Self-pay | Admitting: Nurse Practitioner

## 2023-11-25 DIAGNOSIS — E039 Hypothyroidism, unspecified: Secondary | ICD-10-CM

## 2023-11-25 NOTE — Telephone Encounter (Signed)
 Copied from CRM [id number]. Topic: Clinical - Medication Refill >> Nov 25, 2023  4:43 PM Corin V wrote: Most Recent Primary Care Visit:  Provider: Sharon Seller  Department: [facility]  Visit Type: MEDICARE AWV, SEQUENTIAL  Date: 10/01/2023  Medication: levothyroxine (SYNTHROID) 75 MCG tablet  Has the patient contacted their pharmacy? Yes (Agent: If no, request that the patient contact the pharmacy for the refill. If patient does not wish to contact the pharmacy document the reason why and proceed with request.) (Agent: If yes, when and what did the pharmacy advise?)  Is this the correct pharmacy for this prescription? Yes If no, delete pharmacy and type the correct one.  This is the patient's preferred pharmacy:  [facility] [id number], [place] - [address] [place] [address] Phone: [phone] Fax: [phone]   Has the prescription been filled recently? No  Is the patient out of the medication? No  Has the patient been seen for an appointment in the last year OR does the patient have an upcoming appointment? Yes  Can we respond through MyChart? Yes  Agent: Please be advised that Rx refills may take up to 3 business days. We ask that you follow-up with your pharmacy.

## 2023-11-26 MED ORDER — LEVOTHYROXINE SODIUM 75 MCG PO TABS: 75.0000 ug | ORAL_TABLET | Freq: Every day | ORAL | 3 refills | Status: AC

## 2024-01-14 ENCOUNTER — Other Ambulatory Visit: Payer: Self-pay | Admitting: Nurse Practitioner

## 2024-02-19 ENCOUNTER — Ambulatory Visit: Payer: Medicare Other | Admitting: Nurse Practitioner

## 2024-02-19 ENCOUNTER — Encounter: Payer: Self-pay | Admitting: Nurse Practitioner

## 2024-02-19 VITALS — BP 124/90 | HR 100 | Temp 97.7°F | Resp 18 | Ht 62.0 in | Wt 159.2 lb

## 2024-02-19 DIAGNOSIS — K5904 Chronic idiopathic constipation: Secondary | ICD-10-CM

## 2024-02-19 DIAGNOSIS — M170 Bilateral primary osteoarthritis of knee: Secondary | ICD-10-CM | POA: Diagnosis not present

## 2024-02-19 DIAGNOSIS — E039 Hypothyroidism, unspecified: Secondary | ICD-10-CM | POA: Diagnosis not present

## 2024-02-19 DIAGNOSIS — I10 Essential (primary) hypertension: Secondary | ICD-10-CM

## 2024-02-19 DIAGNOSIS — H353290 Exudative age-related macular degeneration, unspecified eye, stage unspecified: Secondary | ICD-10-CM

## 2024-02-19 DIAGNOSIS — W19XXXA Unspecified fall, initial encounter: Secondary | ICD-10-CM

## 2024-02-19 DIAGNOSIS — Z66 Do not resuscitate: Secondary | ICD-10-CM

## 2024-02-19 DIAGNOSIS — F339 Major depressive disorder, recurrent, unspecified: Secondary | ICD-10-CM | POA: Diagnosis not present

## 2024-02-19 DIAGNOSIS — E785 Hyperlipidemia, unspecified: Secondary | ICD-10-CM

## 2024-02-19 DIAGNOSIS — J449 Chronic obstructive pulmonary disease, unspecified: Secondary | ICD-10-CM

## 2024-02-19 DIAGNOSIS — E1151 Type 2 diabetes mellitus with diabetic peripheral angiopathy without gangrene: Secondary | ICD-10-CM

## 2024-02-19 DIAGNOSIS — R2681 Unsteadiness on feet: Secondary | ICD-10-CM

## 2024-02-19 NOTE — Assessment & Plan Note (Signed)
 Ongoing, encourage pt/ot per home health for gait and strength training, also needing home safety evaluation and education for assistive devices

## 2024-02-19 NOTE — Assessment & Plan Note (Signed)
 Ongoing, encourage to increase fiber intake- can use benafiber daily and add miralax every other day.

## 2024-02-19 NOTE — Assessment & Plan Note (Signed)
 Blood pressure well controlled off medications at this time goal bp <140/90 Continue dietary modifications follow metabolic panel

## 2024-02-19 NOTE — Assessment & Plan Note (Signed)
 Encouraged to continue dietary compliance, routine foot care/monitoring and to keep up with diabetic eye exams through ophthalmology

## 2024-02-19 NOTE — Progress Notes (Signed)
Careteam: Patient Care Team: Verma Gobble, NP as PCP - General (Geriatric Medicine) Dema Filler, MD as Consulting Physician (Ophthalmology)  PLACE OF SERVICE:  [facility]  Advanced Directive information    Allergies  Allergen Reactions   Ace Inhibitors Other (See Comments)   Iodinated Contrast Media    Iodine     Iodine Contrast   Other Rash    Chief Complaint  Patient presents with   Medical Management of Chronic Issues    Six Months Follow-up. In addition needs to discuss eye exam(requesting eye exam results has been faxed through via EP[facility]) and Hemoglobin A1C ( Order is pending)    HPI:  Discussed the use of AI scribe software for clinical note transcription with the patient, who gave verbal consent to proceed.  History of Present Illness   Discussed the use of AI scribe software for clinical note transcription with the patient, who gave verbal consent to proceed.  History of Present Illness   Barbara Lopez is an 89 year old female who presents for a routine follow-up visit.  In May, she experienced a fall without injury but has since been more unsteady on her feet. She uses a rollator and a walker for mobility. Her daughter notes she reaches out for support from walls or countertops when standing up and walking.  She has a history of knee pain, which was a deterrent to continuing physical therapy in the past. Her knee was sore before the fall, and she did not wish to continue therapy after one session due to discomfort. Using meloxicam  daily  She experiences fatigue and takes two naps daily, one in the morning and one in the afternoon. She reports sleeping a lot and feels it is beneficial.  She is currently taking Respimat two puffs daily for breathing   Protonix  for indigestion and acid reflux. She experiences occasional choking at night but no worsening of indigestion or acid reflux.   Zetia  for cholesterol  Effexor  75 mg twice a day for mood.   She is not on any medication for blood pressure.  She experiences cyclical constipation, managed with MiraLAX, which sometimes leads to diarrhea if overused. She adjusts the dosage as needed.  Her family history includes early deaths of her parents at age 30 and her siblings, with one daughter having passed away from cancer. She expresses concerns about her own mortality, noting that she has outlived her family members.  She has a history of being targeted by scams, and her daughter manages her mail and phone calls to prevent any fraudulent activities.   Review of Systems:  Review of Systems  Constitutional:  Negative for chills, fever and weight loss.  HENT:  Positive for hearing loss. Negative for tinnitus.   Respiratory:  Negative for cough, sputum production and shortness of breath.   Cardiovascular:  Negative for chest pain, palpitations and leg swelling.  Gastrointestinal:  Negative for abdominal pain, constipation, diarrhea and heartburn.  Genitourinary:  Negative for dysuria, frequency and urgency.  Musculoskeletal:  Positive for joint pain. Negative for back pain, falls and myalgias.  Skin: Negative.   Neurological:  Negative for dizziness and headaches.  Psychiatric/Behavioral:  Negative for depression and memory loss. The patient does not have insomnia.     Past Medical History:  Diagnosis Date   Anxiety    Asthma    Bell's palsy    Hearing loss bilateral   Hypothyroidism    Left wrist fracture    Macular  degeneration, wet (HCC) nxiety   Non Hodgkin's lymphoma (HCC)    Osteoarthritis    Past Surgical History:  Procedure Laterality Date   APPENDECTOMY     CATARACT EXTRACTION     CHOLECYSTECTOMY     TONSILLECTOMY     TUBAL LIGATION     Social History:   reports that she has never smoked. She has never used smokeless tobacco. She reports that she does not drink alcohol and does not use drugs.  Family History  Problem Relation Age of Onset   Throat cancer Mother     Heart attack Father    Lung cancer Sister    Dementia Sister    COPD Sister    Obesity Daughter    Pulmonary embolism Daughter    Breast cancer Daughter    Endometriosis Daughter    Hashimoto's thyroiditis Daughter     Medications: Patient's Medications  New Prescriptions   No medications on file  Previous Medications   ALBUTEROL  (VENTOLIN  HFA) 108 (90 BASE) MCG/ACT INHALER    INHALE TWO PUFFS BY MOUTH DAILY AS NEEDED FOR WHEEZING OR SHORTNESS OF BREATH **EMERGENCY USE**   CALCIUM CARB-CHOLECALCIFEROL (CALCIUM/VITAMIN D PO)    Take 2 tablets by mouth daily.   EZETIMIBE  (ZETIA ) 10 MG TABLET    TAKE 1 TABLET BY MOUTH DAILY   HYDROXYZINE  (ATARAX ) 10 MG TABLET    TAKE 1 TABLET BY MOUTH DAILY AS NEEDED   LEVOTHYROXINE  (SYNTHROID ) 75 MCG TABLET    Take 1 tablet (75 mcg total) by mouth daily.   MELOXICAM  (MOBIC ) 15 MG TABLET    TAKE 1 TABLET BY MOUTH DAILY   MULTIPLE VITAMINS-MINERALS (PRESERVISION AREDS PO)    Take by mouth in the morning and at bedtime.   NUTRITIONAL SUPPLEMENTS (FRUIT & VEGETABLE DAILY) CAPS    Take by mouth. 2 fruit and 2 Veggie supplements daily (Balance of Nature)   PANTOPRAZOLE  (PROTONIX ) 40 MG TABLET    Take 1 tablet (40 mg total) by mouth daily.   STIOLTO RESPIMAT  2.5-2.5 MCG/ACT AERS    INHALE 2 PUFFS BY MOUTH DAILY   VENLAFAXINE  (EFFEXOR ) 75 MG TABLET    TAKE 1 TABLET BY MOUTH 2 TIMES A DAY  Modified Medications   No medications on file  Discontinued Medications   No medications on file    Physical Exam:  Vitals:   02/19/24 0907  BP: (!) 124/90  Pulse: 100  Resp: 18  Temp: 97.7 F (36.5 C)  SpO2: 95%  Weight: 159 lb 3.2 oz (72.2 kg)  Height: 5' 2 (1.575 m)   Body mass index is 29.12 kg/m. Wt Readings from Last 3 Encounters:  02/19/24 159 lb 3.2 oz (72.2 kg)  08/20/23 158 lb 9.6 oz (71.9 kg)  05/21/23 148 lb (67.1 kg)    Physical Exam Constitutional:      General: She is not in acute distress.    Appearance: She is well-developed. She  is not diaphoretic.  HENT:     Head: Normocephalic and atraumatic.     Mouth/Throat:     Pharynx: No oropharyngeal exudate.   Eyes:     Conjunctiva/sclera: Conjunctivae normal.     Pupils: Pupils are equal, round, and reactive to light.    Cardiovascular:     Rate and Rhythm: Normal rate and regular rhythm.     Heart sounds: Normal heart sounds.  Pulmonary:     Effort: Pulmonary effort is normal.     Breath sounds: Normal breath sounds.  Abdominal:  General: Bowel sounds are normal.     Palpations: Abdomen is soft.   Musculoskeletal:     Cervical back: Normal range of motion and neck supple.     Right lower leg: No edema.     Left lower leg: No edema.   Skin:    General: Skin is warm and dry.   Neurological:     Mental Status: She is alert. Mental status is at baseline.     Gait: Gait abnormal.   Psychiatric:        Mood and Affect: Mood normal.     Labs reviewed: Basic Metabolic Panel: Recent Labs    08/20/23 1540  NA 139  K 5.1  CL 102  CO2 30  GLUCOSE 93  BUN 19  CREATININE 0.74  CALCIUM 9.8  TSH 2.86   Liver Function Tests: Recent Labs    08/20/23 1540  AST 14  ALT 13  BILITOT 0.5  PROT 7.0   No results for input(s): LIPASE, AMYLASE in the last 8760 hours. No results for input(s): AMMONIA in the last 8760 hours. CBC: Recent Labs    08/20/23 1540  WBC 6.9  NEUTROABS 3,278  HGB 13.8  HCT 42.2  MCV 91.9  PLT 285   Lipid Panel: No results for input(s): CHOL, HDL, LDLCALC, TRIG, CHOLHDL, LDLDIRECT in the last 8760 hours. TSH: Recent Labs    08/20/23 1540  TSH 2.86   A1C: Lab Results  Component Value Date   HGBA1C 6.3 (H) 08/20/2023     Assessment/Plan Bilateral primary osteoarthritis of knee Assessment & Plan: Ongoing, continues on meloxicam    Orders: -     Ambulatory referral to Home Health  Chronic obstructive pulmonary disease, unspecified COPD type (HCC) Assessment & Plan: Stable on stiolto  daily    Acquired hypothyroidism Assessment & Plan: TSH at goal on last lab, continues on synthroid  75 mcg    Major depression, recurrent, chronic (HCC) Assessment & Plan: Controlled on effexor  twice daily   Hyperlipidemia LDL goal <100 Assessment & Plan: Continues on zetia  with dietary modifications.   Orders: -     Lipid panel  Primary hypertension Assessment & Plan: Blood pressure well controlled off medications at this time goal bp <140/90 Continue dietary modifications follow metabolic panel  Orders: -     COMPLETE METABOLIC PANEL WITHOUT GFR -     CBC with Differential/Platelet  Fall, initial encounter -     Ambulatory referral to Home Health  Unsteady gait Assessment & Plan: Ongoing, encourage pt/ot per home health for gait and strength training, also needing home safety evaluation and education for assistive devices  Orders: -     Ambulatory referral to Home Health  Type 2 diabetes mellitus with diabetic peripheral angiopathy without gangrene, without long-term current use of insulin (HCC) Assessment & Plan: Encouraged to continue dietary compliance, routine foot care/monitoring and to keep up with diabetic eye exams through ophthalmology    Orders: -     Hemoglobin A1c  DNR (do not resuscitate) -     Do not attempt resuscitation (DNR)  Chronic idiopathic constipation Assessment & Plan: Ongoing, encourage to increase fiber intake- can use benafiber daily and add miralax every other day.    Exudative age-related macular degeneration, unspecified laterality, unspecified stage [facility]) Assessment & Plan: Continues to follow up with ophthalmology.       Return in about 6 months (around 08/20/2024) for routine follow up.  Barbara Lopez K. Denney Fisherman [facility]  [phone]  

## 2024-02-19 NOTE — Assessment & Plan Note (Signed)
 Stable on stiolto daily

## 2024-02-19 NOTE — Assessment & Plan Note (Signed)
 Continues on zetia  with dietary modifications.

## 2024-02-19 NOTE — Assessment & Plan Note (Signed)
Continues to follow-up with ophthalmology

## 2024-02-19 NOTE — Assessment & Plan Note (Signed)
 Controlled on effexor  twice daily

## 2024-02-19 NOTE — Assessment & Plan Note (Signed)
 TSH at goal on last lab, continues on synthroid  75 mcg

## 2024-02-19 NOTE — Assessment & Plan Note (Signed)
 Ongoing, continues on meloxicam 

## 2024-02-19 NOTE — Patient Instructions (Signed)
 Benefiber in coffee daily Miralax a few times a week- can adjust dose

## 2024-02-20 LAB — CBC WITH DIFFERENTIAL/PLATELET
Absolute Lymphocytes: 2312 {cells}/uL (ref 850–3900)
Absolute Monocytes: 378 {cells}/uL (ref 200–950)
Basophils Absolute: 50 {cells}/uL (ref 0–200)
Basophils Relative: 0.8 %
Eosinophils Absolute: 189 {cells}/uL (ref 15–500)
Eosinophils Relative: 3 %
HCT: 45.1 % — ABNORMAL HIGH (ref 35.0–45.0)
Hemoglobin: 14.4 g/dL (ref 11.7–15.5)
MCH: 29.8 pg (ref 27.0–33.0)
MCHC: 31.9 g/dL — ABNORMAL LOW (ref 32.0–36.0)
MCV: 93.4 fL (ref 80.0–100.0)
MPV: 9.3 fL (ref 7.5–12.5)
Monocytes Relative: 6 %
Neutro Abs: 3371 {cells}/uL (ref 1500–7800)
Neutrophils Relative %: 53.5 %
Platelets: 292 10*3/uL (ref 140–400)
RBC: 4.83 10*6/uL (ref 3.80–5.10)
RDW: 14.7 % (ref 11.0–15.0)
Total Lymphocyte: 36.7 %
WBC: 6.3 10*3/uL (ref 3.8–10.8)

## 2024-02-20 LAB — COMPLETE METABOLIC PANEL WITHOUT GFR
AG Ratio: 2 (calc) (ref 1.0–2.5)
ALT: 16 U/L (ref 6–29)
AST: 17 U/L (ref 10–35)
Albumin: 4.4 g/dL (ref 3.6–5.1)
Alkaline phosphatase (APISO): 72 U/L (ref 37–153)
BUN: 20 mg/dL (ref 7–25)
CO2: 30 mmol/L (ref 20–32)
Calcium: 9.5 mg/dL (ref 8.6–10.4)
Chloride: 104 mmol/L (ref 98–110)
Creat: 0.87 mg/dL (ref 0.60–0.95)
Globulin: 2.2 g/dL (ref 1.9–3.7)
Glucose, Bld: 128 mg/dL — ABNORMAL HIGH (ref 65–99)
Potassium: 4.9 mmol/L (ref 3.5–5.3)
Sodium: 141 mmol/L (ref 135–146)
Total Bilirubin: 0.5 mg/dL (ref 0.2–1.2)
Total Protein: 6.6 g/dL (ref 6.1–8.1)

## 2024-02-20 LAB — LIPID PANEL
Cholesterol: 196 mg/dL (ref <200)
HDL: 67 mg/dL (ref >=50)
LDL Cholesterol (Calc): 112 mg/dL — ABNORMAL HIGH
Non-HDL Cholesterol (Calc): 129 mg/dL (ref <130)
Total CHOL/HDL Ratio: 2.9 (calc) (ref <5.0)
Triglycerides: 78 mg/dL (ref <150)

## 2024-02-20 LAB — HEMOGLOBIN A1C
Hgb A1c MFr Bld: 6.6 % — ABNORMAL HIGH (ref <5.7)
Mean Plasma Glucose: 143 mg/dL
eAG (mmol/L): 7.9 mmol/L

## 2024-02-22 ENCOUNTER — Ambulatory Visit: Payer: Self-pay | Admitting: Nurse Practitioner

## 2024-02-28 ENCOUNTER — Other Ambulatory Visit: Payer: Self-pay | Admitting: Nurse Practitioner

## 2024-02-28 MED ORDER — ATORVASTATIN CALCIUM 10 MG PO TABS
ORAL_TABLET | ORAL | 3 refills | Status: AC
Start: 2024-02-28 — End: ?

## 2024-03-03 ENCOUNTER — Encounter: Payer: Self-pay | Admitting: Nurse Practitioner

## 2024-03-03 DIAGNOSIS — H938X1 Other specified disorders of right ear: Secondary | ICD-10-CM

## 2024-03-03 NOTE — Telephone Encounter (Signed)
Message routed to PCP Eubanks, Jessica K, NP  

## 2024-03-22 ENCOUNTER — Encounter (INDEPENDENT_AMBULATORY_CARE_PROVIDER_SITE_OTHER): Payer: Self-pay | Admitting: Otolaryngology

## 2024-03-22 ENCOUNTER — Ambulatory Visit (INDEPENDENT_AMBULATORY_CARE_PROVIDER_SITE_OTHER): Admitting: Otolaryngology

## 2024-03-22 VITALS — HR 103

## 2024-03-22 DIAGNOSIS — H608X3 Other otitis externa, bilateral: Secondary | ICD-10-CM | POA: Diagnosis not present

## 2024-03-22 DIAGNOSIS — H903 Sensorineural hearing loss, bilateral: Secondary | ICD-10-CM | POA: Diagnosis not present

## 2024-03-22 MED ORDER — MOMETASONE FUROATE 0.1 % EX CREA: TOPICAL_CREAM | CUTANEOUS | 3 refills | Status: DC

## 2024-03-23 DIAGNOSIS — H608X3 Other otitis externa, bilateral: Secondary | ICD-10-CM | POA: Insufficient documentation

## 2024-03-23 DIAGNOSIS — H903 Sensorineural hearing loss, bilateral: Secondary | ICD-10-CM | POA: Insufficient documentation

## 2024-03-23 NOTE — Progress Notes (Signed)
 CC: Itchy ears, possible right ear lesion  HPI:  Barbara Lopez is a 89 y.o. female who presents today complaining of itchy sensation in her ears.  She also has a history of bilateral sensorineural hearing loss.  She currently wears bilateral hearing aids.  She was recently seen by her audiologist, and was noted to have a possible lesion on her right tympanic membrane.  Currently the patient denies any otalgia, otorrhea, vertigo, or recent change in her hearing.  Past Medical History:  Diagnosis Date   Anxiety    Asthma    Bell's palsy    Hearing loss bilateral   Hypothyroidism    Left wrist fracture    Macular degeneration, wet (HCC) nxiety   Non Hodgkin's lymphoma (HCC)    Osteoarthritis     Past Surgical History:  Procedure Laterality Date   APPENDECTOMY     CATARACT EXTRACTION     CHOLECYSTECTOMY     TONSILLECTOMY     TUBAL LIGATION      Family History  Problem Relation Age of Onset   Throat cancer Mother    Heart attack Father    Lung cancer Sister    Dementia Sister    COPD Sister    Obesity Daughter    Pulmonary embolism Daughter    Breast cancer Daughter    Endometriosis Daughter    Hashimoto's thyroiditis Daughter     Social History:  reports that she has never smoked. She has never used smokeless tobacco. She reports that she does not drink alcohol and does not use drugs.  Allergies:  Allergies  Allergen Reactions   Ace Inhibitors Other (See Comments)   Iodinated Contrast Media    Iodine     Iodine Contrast   Other Rash    Prior to Admission medications   Medication Sig Start Date End Date Taking? Authorizing Provider  albuterol  (VENTOLIN  HFA) 108 (90 Base) MCG/ACT inhaler INHALE TWO PUFFS BY MOUTH DAILY AS NEEDED FOR WHEEZING OR SHORTNESS OF BREATH **EMERGENCY USE** 05/05/22  Yes Eubanks, Jessica K, NP  atorvastatin  (LIPITOR) 10 MG tablet 1 tablet by mouth three times weekly, best taken at bedtime. 02/28/24  Yes Caro Harlene POUR, NP  Calcium   Carb-Cholecalciferol (CALCIUM /VITAMIN D PO) Take 2 tablets by mouth daily.   Yes [provider]  ezetimibe  (ZETIA ) 10 MG tablet TAKE 1 TABLET BY MOUTH DAILY 08/14/23  Yes Eubanks, Jessica K, NP  hydrOXYzine  (ATARAX ) 10 MG tablet TAKE 1 TABLET BY MOUTH DAILY AS NEEDED 09/02/22  Yes Eubanks, Jessica K, NP  levothyroxine  (SYNTHROID ) 75 MCG tablet Take 1 tablet (75 mcg total) by mouth daily. 11/26/23  Yes Caro Harlene POUR, NP  meloxicam  (MOBIC ) 15 MG tablet TAKE 1 TABLET BY MOUTH DAILY 08/14/23  Yes Vernetta Lonni GRADE, MD  mometasone  (ELOCON ) 0.1 % cream Apply topically daily as needed for itch 03/22/24  Yes Karis Clunes, MD  Multiple Vitamins-Minerals (PRESERVISION AREDS PO) Take by mouth in the morning and at bedtime.   Yes [provider]  Nutritional Supplements (FRUIT & VEGETABLE DAILY) CAPS Take by mouth. 2 fruit and 2 Veggie supplements daily (Balance of Nature)   Yes [provider]  pantoprazole  (PROTONIX ) 40 MG tablet Take 1 tablet (40 mg total) by mouth daily. 05/21/23  Yes Caro Harlene POUR, NP  STIOLTO RESPIMAT  2.5-2.5 MCG/ACT AERS INHALE 2 PUFFS BY MOUTH DAILY 01/15/24  Yes Eubanks, Jessica K, NP  venlafaxine  (EFFEXOR ) 75 MG tablet TAKE 1 TABLET BY MOUTH 2 TIMES A DAY  11/25/23  Yes Caro Harlene POUR, NP    Pulse (!) 103, SpO2 94%. Exam: General: Communicates without difficulty, well nourished, no acute distress. Head: Normocephalic, no evidence injury, no tenderness, facial buttresses intact without stepoff. Face/sinus: No tenderness to palpation and percussion. Facial movement is weaker on the left side, secondary to her Bell's palsy.  Eyes: PERRL, EOMI. No scleral icterus, conjunctivae clear. Neuro: CN II exam reveals vision grossly intact.  No nystagmus at any point of gaze. Ears: Auricles well formed without lesions.  Ear canals with eczematous changes.  The TMs are intact without fluid. Nose: External evaluation reveals normal support and skin without lesions.   Dorsum is intact.  Anterior rhinoscopy reveals congested mucosa over anterior aspect of inferior turbinates and intact septum.  No purulence noted. Oral:  Oral cavity and oropharynx are intact, symmetric, without erythema or edema.  Mucosa is moist without lesions. Neck: Full range of motion without pain.  There is no significant lymphadenopathy.  No masses palpable.  Thyroid bed within normal limits to palpation.  Parotid glands and submandibular glands equal bilaterally without mass.  Trachea is midline.   Assessment: 1.  Bilateral chronic eczematous otitis externa. 2.  No lesion is noted on the patient's tympanic membrane. 3.  Bilateral sensorineural hearing loss.  Plan: 1.  The physical exam findings are reviewed with the patient and her daughter. 2.  Elocon  cream to treat the chronic eczematous otitis externa. 3.  The patient is encouraged to call with any questions or concerns.  Barbara Lopez 03/23/2024, 8:21 AM

## 2024-05-23 ENCOUNTER — Encounter: Payer: Self-pay | Admitting: Nurse Practitioner

## 2024-05-24 MED ORDER — HYDROXYZINE HCL 10 MG PO TABS: 10.0000 mg | ORAL_TABLET | Freq: Every day | ORAL | 0 refills | Status: AC | PRN

## 2024-05-30 ENCOUNTER — Other Ambulatory Visit: Payer: Self-pay | Admitting: Nurse Practitioner

## 2024-05-30 DIAGNOSIS — K219 Gastro-esophageal reflux disease without esophagitis: Secondary | ICD-10-CM

## 2024-07-11 ENCOUNTER — Encounter: Payer: Self-pay | Admitting: Radiology

## 2024-07-11 ENCOUNTER — Other Ambulatory Visit: Payer: Self-pay | Admitting: Nurse Practitioner

## 2024-08-16 ENCOUNTER — Other Ambulatory Visit: Payer: Self-pay | Admitting: Nurse Practitioner

## 2024-08-16 ENCOUNTER — Ambulatory Visit: Payer: Medicare Other | Admitting: Dermatology

## 2024-08-17 ENCOUNTER — Ambulatory Visit: Payer: Medicare Other | Admitting: Dermatology

## 2024-08-17 ENCOUNTER — Telehealth: Payer: Self-pay

## 2024-08-17 NOTE — Telephone Encounter (Signed)
° °  Left voicemail for return call in regard to manufacturer change, pt needs to confirm if ok for change and ok for E2C2 to ask as well.

## 2024-08-18 NOTE — Telephone Encounter (Signed)
 Left message on voicemail for patient to return call when available

## 2024-08-22 ENCOUNTER — Ambulatory Visit: Payer: Self-pay | Admitting: Nurse Practitioner

## 2024-08-22 ENCOUNTER — Encounter: Payer: Self-pay | Admitting: Nurse Practitioner

## 2024-08-22 NOTE — Telephone Encounter (Signed)
 See my chart message. Reached out to the pharmacy and gave the okay to change the manufacturer brand

## 2024-08-22 NOTE — Telephone Encounter (Signed)
 Copied from CRM #[id number]. Topic: General - Other >> Aug 22, 2024  9:13 AM Carrielelia G wrote: Reason for CRM: Arland the Daughter returning Suellen Devin BROCKS, CMA call. Alondra connected but it went to vox mail.SABRA  Please advise   Left message on voicemail for patient to return call when available  [place] for e2c2 to get document patients response to question about manufacture change

## 2024-09-07 ENCOUNTER — Ambulatory Visit (INDEPENDENT_AMBULATORY_CARE_PROVIDER_SITE_OTHER): Admitting: Nurse Practitioner

## 2024-09-07 VITALS — BP 120/76 | HR 106 | Temp 97.3°F | Ht 62.0 in | Wt 161.0 lb

## 2024-09-07 DIAGNOSIS — I10 Essential (primary) hypertension: Secondary | ICD-10-CM

## 2024-09-07 DIAGNOSIS — E785 Hyperlipidemia, unspecified: Secondary | ICD-10-CM | POA: Diagnosis not present

## 2024-09-07 DIAGNOSIS — M170 Bilateral primary osteoarthritis of knee: Secondary | ICD-10-CM

## 2024-09-07 DIAGNOSIS — E1151 Type 2 diabetes mellitus with diabetic peripheral angiopathy without gangrene: Secondary | ICD-10-CM

## 2024-09-07 DIAGNOSIS — F325 Major depressive disorder, single episode, in full remission: Secondary | ICD-10-CM

## 2024-09-07 DIAGNOSIS — E039 Hypothyroidism, unspecified: Secondary | ICD-10-CM

## 2024-09-07 DIAGNOSIS — R413 Other amnesia: Secondary | ICD-10-CM

## 2024-09-07 DIAGNOSIS — J449 Chronic obstructive pulmonary disease, unspecified: Secondary | ICD-10-CM

## 2024-09-07 DIAGNOSIS — K219 Gastro-esophageal reflux disease without esophagitis: Secondary | ICD-10-CM | POA: Diagnosis not present

## 2024-09-07 MED ORDER — DONEPEZIL HCL 10 MG PO TABS: ORAL_TABLET | ORAL | 1 refills | Status: AC

## 2024-09-07 NOTE — Progress Notes (Signed)
 "   Careteam: Patient Care Team: Caro Harlene POUR, NP as PCP - General (Geriatric Medicine) Leslee Reusing, MD as Consulting Physician (Ophthalmology)  PLACE OF SERVICE:  [facility]  Advanced Directive information    Allergies[1]  Chief Complaint  Patient presents with   medication management of chronic issues    Patient presents today for routine 6 month follow up. Lab work and foot exam due.     HPI:  Discussed the use of AI scribe software for clinical note transcription with the patient, who gave verbal consent to proceed.  History of Present Illness Barbara Lopez is an 89 year old female who presents with memory concerns.  She has been experiencing memory lapses, such as forgetting names of family members and other details, although she often recalls them later. She has been taking an over-the-counter supplement recommended by a family member, but it has not shown improvement. A CT scan of the head from last year showed mild to moderate diffuse cerebral volume loss and chronic microvascular ischemic changes.  She has a history of hyperlipidemia and is currently taking Lipitor 10 mg three times weekly and Zetia  10 mg daily.  She has hypothyroidism and is on Synthroid  75 mcg daily.   She is not currently taking medication for blood pressure,   she maintains her diabetes through diet control. She is not currently on medication for diabetes.  She experiences knee pain, which has worsened recently, possibly due to colder weather. She takes Mobic  (meloxicam ) daily for osteoarthritis, she is followed by orthopedic for OA of knees.  does not use a walker routinely, but takes it for longer outings.  She experiences visual migraines, which have decreased in frequency over the past month. Her solution is to lie down, which helps alleviate the symptoms.   She also experiences constipation, for which she takes Miralax occasionally, leading to concerns about diarrhea.  Her mood  is stable, and she does not express any worsening depression or anxiety. She takes Effexor  75 mg twice daily and Atarax  as needed for anxiety related to eye injections. She lives with her daughter.    Review of Systems:  Review of Systems  Constitutional:  Negative for chills, fever and weight loss.  HENT:  Negative for tinnitus.   Respiratory:  Negative for cough, sputum production and shortness of breath.   Cardiovascular:  Negative for chest pain, palpitations and leg swelling.  Gastrointestinal:  Negative for abdominal pain, constipation, diarrhea and heartburn.  Genitourinary:  Negative for dysuria, frequency and urgency.  Musculoskeletal:  Positive for joint pain. Negative for back pain, falls and myalgias.  Skin: Negative.   Neurological:  Negative for dizziness and headaches.  Psychiatric/Behavioral:  Positive for memory loss. Negative for depression. The patient is nervous/anxious. The patient does not have insomnia.     Past Medical History:  Diagnosis Date   Anxiety    Asthma    Bell's palsy    Hearing loss bilateral   Hypothyroidism    Left wrist fracture    Macular degeneration, wet (HCC) nxiety   Non Hodgkin's lymphoma (HCC)    Osteoarthritis    Past Surgical History:  Procedure Laterality Date   APPENDECTOMY     CATARACT EXTRACTION     CHOLECYSTECTOMY     TONSILLECTOMY     TUBAL LIGATION     Social History:   reports that she has never smoked. She has never used smokeless tobacco. She reports that she does not drink alcohol and does not use  drugs.  Family History  Problem Relation Age of Onset   Throat cancer Mother    Heart attack Father    Lung cancer Sister    Dementia Sister    COPD Sister    Obesity Daughter    Pulmonary embolism Daughter    Breast cancer Daughter    Endometriosis Daughter    Hashimoto's thyroiditis Daughter     Medications: Patient's Medications  New Prescriptions   No medications on file  Previous Medications    ALBUTEROL  (VENTOLIN  HFA) 108 (90 BASE) MCG/ACT INHALER    INHALE TWO PUFFS BY MOUTH DAILY AS NEEDED FOR WHEEZING OR SHORTNESS OF BREATH **EMERGENCY USE**   ATORVASTATIN  (LIPITOR) 10 MG TABLET    1 tablet by mouth three times weekly, best taken at bedtime.   CALCIUM  CARB-CHOLECALCIFEROL (CALCIUM /VITAMIN D PO)    Take 2 tablets by mouth daily.   EZETIMIBE  (ZETIA ) 10 MG TABLET    TAKE 1 TABLET BY MOUTH DAILY   HYDROXYZINE  (ATARAX ) 10 MG TABLET    Take 1 tablet (10 mg total) by mouth daily as needed.   LEVOTHYROXINE  (SYNTHROID ) 75 MCG TABLET    Take 1 tablet (75 mcg total) by mouth daily.   MELOXICAM  (MOBIC ) 15 MG TABLET    TAKE 1 TABLET BY MOUTH DAILY   MOMETASONE  (ELOCON ) 0.1 % CREAM    Apply topically daily as needed for itch   MULTIPLE VITAMINS-MINERALS (PRESERVISION AREDS PO)    Take by mouth in the morning and at bedtime.   NUTRITIONAL SUPPLEMENTS (FRUIT & VEGETABLE DAILY) CAPS    Take by mouth. 2 fruit and 2 Veggie supplements daily (Balance of Nature)   PANTOPRAZOLE  (PROTONIX ) 40 MG TABLET    TAKE 1 TABLET BY MOUTH DAILY   STIOLTO RESPIMAT  2.5-2.5 MCG/ACT AERS    INHALE 2 INHALATIONS BY MOUTH DAILY   TURMERIC (QC TUMERIC COMPLEX PO)    Take 1 tablet by mouth in the morning.   VENLAFAXINE  (EFFEXOR ) 75 MG TABLET    TAKE 1 TABLET BY MOUTH 2 TIMES A DAY  Modified Medications   No medications on file  Discontinued Medications   No medications on file    Physical Exam:  Vitals:   09/07/24 1014  BP: 120/76  Pulse: (!) 106  Temp: (!) 97.3 F (36.3 C)  SpO2: 97%  Weight: 161 lb (73 kg)  Height: 5' 2 (1.575 m)   Body mass index is 29.45 kg/m. Wt Readings from Last 3 Encounters:  09/07/24 161 lb (73 kg)  02/19/24 159 lb 3.2 oz (72.2 kg)  08/20/23 158 lb 9.6 oz (71.9 kg)    Physical Exam Constitutional:      General: She is not in acute distress.    Appearance: She is well-developed. She is not diaphoretic.  HENT:     Head: Normocephalic and atraumatic.     Mouth/Throat:      Pharynx: No oropharyngeal exudate.  Eyes:     Conjunctiva/sclera: Conjunctivae normal.     Pupils: Pupils are equal, round, and reactive to light.  Cardiovascular:     Rate and Rhythm: Normal rate and regular rhythm.     Heart sounds: Normal heart sounds.  Pulmonary:     Effort: Pulmonary effort is normal.     Breath sounds: Normal breath sounds.  Abdominal:     General: Bowel sounds are normal.     Palpations: Abdomen is soft.  Musculoskeletal:     Cervical back: Normal range of motion and neck supple.  Right lower leg: No edema.     Left lower leg: No edema.  Skin:    General: Skin is warm and dry.  Neurological:     Mental Status: She is alert.  Psychiatric:        Mood and Affect: Mood normal.     Labs reviewed: Basic Metabolic Panel: Recent Labs    02/19/24 0940  NA 141  K 4.9  CL 104  CO2 30  GLUCOSE 128*  BUN 20  CREATININE 0.87  CALCIUM  9.5   Liver Function Tests: Recent Labs    02/19/24 0940  AST 17  ALT 16  BILITOT 0.5  PROT 6.6   No results for input(s): LIPASE, AMYLASE in the last 8760 hours. No results for input(s): AMMONIA in the last 8760 hours. CBC: Recent Labs    02/19/24 0940  WBC 6.3  NEUTROABS 3,371  HGB 14.4  HCT 45.1*  MCV 93.4  PLT 292   Lipid Panel: Recent Labs    02/19/24 0940  CHOL 196  HDL 67  LDLCALC 112*  TRIG 78  CHOLHDL 2.9   TSH: No results for input(s): TSH in the last 8760 hours. A1C: Lab Results  Component Value Date   HGBA1C 6.6 (H) 02/19/2024     Assessment/Plan Assessment and Plan Assessment & Plan Memory loss due to chronic microvascular ischemic change Chronic microvascular ischemic changes with mild to moderate diffuse cerebral volume loss. Memory lapses present but not interfering with daily life. Discussed potential benefits of Aricept to slow progression. Emphasized importance of controlling cholesterol to prevent further microvascular changes. - Started Aricept 5 mg at night,  increase to 10 mg after one month if tolerated. - Ordered routine lab work including electrolytes, kidney and liver function, and B12 level. - Checked cholesterol levels.  Hyperlipidemia Cholesterol was slightly elevated. Currently on Lipitor and Zetia . Discussed importance of controlling cholesterol to prevent further microvascular changes. - Continue Lipitor 10 mg three times weekly. - Continue Zetia  10 mg daily. - Checked cholesterol levels.  Acquired hypothyroidism Currently on Synthroid  75 mcg. Plan to recheck TSH levels to ensure adequate control. - Rechecked TSH levels.  Type 2 diabetes mellitus, diet controlled Diabetes is currently diet controlled. Plan to monitor A1c levels and perform diabetic foot exam. Recommended diabetic eye exams to monitor for any changes. - Checked A1c levels. - Performed diabetic foot exam. - Recommended diabetic eye exams.  Primary hypertension Blood pressure is well controlled with diet. No current medication for hypertension. - Continue current diet to maintain blood pressure control.  Bilateral primary osteoarthritis of knee Knee pain has worsened, possibly due to colder weather. Currently using Mobic  daily, but advised to use sparingly due to potential kidney and stomach issues. Discussed using Tylenol  as first-line treatment for pain management. - Use Tylenol  500 mg three times a day as first-line treatment for pain. - Use Mobic  (meloxicam ) 7.5 mg daily -half tablet as needed for severe pain can use other half if needed. - Encouraged use of walker for support during activities.  Gastroesophageal reflux disease Currently on Protonix  for indigestion. No worsening of symptoms reported. - Continue Protonix .  Major depressive disorder, in remission Depression is in remission. No dark thoughts or significant anxiety reported. Currently on Effexor  75 mg twice daily. - Continue Effexor  75 mg twice daily.  General health maintenance Discussed  importance of maintaining a healthy diet and lifestyle. - Encouraged healthy diet and lifestyle.  Return in about 6 months (around 03/07/2025) for routine follow up,  labs at time of visit.  Joandy Burget K. Caro BODILY [facility] [phone]     [1]  Allergies Allergen Reactions   Ace Inhibitors Other (See Comments)   Iodinated Contrast Media    Iodine     Iodine Contrast   Other Rash   "

## 2024-09-07 NOTE — Patient Instructions (Addendum)
 Can take tylenol  1000 mg by mouth scheduled 3 times daily -- MAX 3000 mg in 24 hours  Still having pain- use half mobic - MELOxicam - 7.5 mg daily If pain is severe then can use other half but try to avoid routine use of meloxicam     Start aricept 5 mg daily at bedtime for 1 month If doing well then increase to 10 mg daily

## 2024-09-08 LAB — LIPID PANEL
Cholesterol: 153 mg/dL
HDL: 62 mg/dL
LDL Cholesterol (Calc): 72 mg/dL
Non-HDL Cholesterol (Calc): 91 mg/dL
Total CHOL/HDL Ratio: 2.5 (calc)
Triglycerides: 107 mg/dL

## 2024-09-08 LAB — COMPREHENSIVE METABOLIC PANEL WITH GFR
AG Ratio: 1.8 (calc) (ref 1.0–2.5)
ALT: 29 U/L (ref 6–29)
AST: 22 U/L (ref 10–35)
Albumin: 4.4 g/dL (ref 3.6–5.1)
Alkaline phosphatase (APISO): 76 U/L (ref 37–153)
BUN: 14 mg/dL (ref 7–25)
CO2: 31 mmol/L (ref 20–32)
Calcium: 9.5 mg/dL (ref 8.6–10.4)
Chloride: 103 mmol/L (ref 98–110)
Creat: 0.83 mg/dL (ref 0.60–0.95)
Globulin: 2.5 g/dL (ref 1.9–3.7)
Glucose, Bld: 113 mg/dL (ref 65–139)
Potassium: 4.6 mmol/L (ref 3.5–5.3)
Sodium: 140 mmol/L (ref 135–146)
Total Bilirubin: 0.6 mg/dL (ref 0.2–1.2)
Total Protein: 6.9 g/dL (ref 6.1–8.1)
eGFR: 68 mL/min/1.73m2

## 2024-09-08 LAB — CBC WITH DIFFERENTIAL/PLATELET
Absolute Lymphocytes: 3145 {cells}/uL (ref 850–3900)
Absolute Monocytes: 503 {cells}/uL (ref 200–950)
Basophils Absolute: 67 {cells}/uL (ref 0–200)
Basophils Relative: 0.9 %
Eosinophils Absolute: 222 {cells}/uL (ref 15–500)
Eosinophils Relative: 3 %
HCT: 44.3 % (ref 35.9–46.0)
Hemoglobin: 14.2 g/dL (ref 11.7–15.5)
MCH: 29.6 pg (ref 27.0–33.0)
MCHC: 32.1 g/dL (ref 31.6–35.4)
MCV: 92.3 fL (ref 81.4–101.7)
MPV: 9.6 fL (ref 7.5–12.5)
Monocytes Relative: 6.8 %
Neutro Abs: 3463 {cells}/uL (ref 1500–7800)
Neutrophils Relative %: 46.8 %
Platelets: 303 Thousand/uL (ref 140–400)
RBC: 4.8 Million/uL (ref 3.80–5.10)
RDW: 14.4 % (ref 11.0–15.0)
Total Lymphocyte: 42.5 %
WBC: 7.4 Thousand/uL (ref 3.8–10.8)

## 2024-09-08 LAB — HEMOGLOBIN A1C
Hgb A1c MFr Bld: 6.3 % — ABNORMAL HIGH
Mean Plasma Glucose: 134 mg/dL
eAG (mmol/L): 7.4 mmol/L

## 2024-09-08 LAB — TSH: TSH: 5.17 m[IU]/L — ABNORMAL HIGH (ref 0.40–4.50)

## 2024-09-08 LAB — VITAMIN B12: Vitamin B-12: >2000 pg/mL — ABNORMAL HIGH (ref 200–1100)

## 2024-09-09 ENCOUNTER — Ambulatory Visit: Payer: Self-pay | Admitting: Nurse Practitioner

## 2024-10-06 ENCOUNTER — Encounter: Payer: Self-pay | Admitting: Nurse Practitioner

## 2024-10-06 ENCOUNTER — Ambulatory Visit: Payer: Medicare Other | Admitting: Nurse Practitioner

## 2024-10-06 DIAGNOSIS — Z0000 Encounter for general adult medical examination without abnormal findings: Secondary | ICD-10-CM | POA: Diagnosis not present

## 2024-10-06 NOTE — Patient Instructions (Signed)
 Barbara Lopez,  Thank you for taking the time for your Medicare Wellness Visit. I appreciate your continued commitment to your health goals. Please review the care plan we discussed, and feel free to reach out if I can assist you further.  Please note that Annual Wellness Visits do not include a physical exam. Some assessments may be limited, especially if the visit was conducted virtually. If needed, we may recommend an in-person follow-up with your provider.  Ongoing Care Seeing your primary care provider every 3 to 6 months helps us  monitor your health and provide consistent, personalized care.   Referrals If a referral was made during today's visit and you haven't received any updates within two weeks, please contact the referred provider directly to check on the status.  Recommended Screenings:  Health Maintenance  Topic Date Due   COVID-19 Vaccine (6 - 2025-26 season) 05/09/2024   Complete foot exam   08/19/2024   Medicare Annual Wellness Visit  09/30/2024   Eye exam for diabetics  10/28/2024   Hemoglobin A1C  03/07/2025   DTaP/Tdap/Td vaccine (2 - Td or Tdap) 12/27/2026   Pneumococcal Vaccine for age over 46  Completed   Flu Shot  Completed   Osteoporosis screening with Bone Density Scan  Completed   Zoster (Shingles) Vaccine  Completed   Meningitis B Vaccine  Aged Out       10/06/2024   10:57 AM  Advanced Directives  Does Patient Have a Medical Advance Directive? Yes  Type of Estate Agent of [place];Living will;Out of facility DNR (pink MOST or yellow form)  Does patient want to make changes to medical advance directive? No - Patient declined  Copy of Healthcare Power of Attorney in Chart? Yes - validated most recent copy scanned in chart (See row information)    Vision: Annual vision screenings are recommended for early detection of glaucoma, cataracts, and diabetic retinopathy. These exams can also reveal signs of chronic conditions such as diabetes  and high blood pressure.  Dental: Annual dental screenings help detect early signs of oral cancer, gum disease, and other conditions linked to overall health, including heart disease and diabetes.  Please see the attached documents for additional preventive care recommendations.

## 2024-10-06 NOTE — Progress Notes (Signed)
 "  Chief Complaint  Patient presents with   Medicare Wellness    AWV     Subjective:   Barbara Lopez is a 89 y.o. female who presents for a Medicare Annual Wellness Visit.  Visit info / Clinical Intake: Medicare Wellness Visit Type:: Welcome to [employer] (IPPE) Persons participating in visit and providing information:: patient & caregiver Medicare Wellness Visit Mode:: Video Since this visit was completed virtually, some vitals may be partially provided or unavailable. Missing vitals are due to the limitations of the virtual format.: Unable to obtain vitals - no equipment If Telephone or Video please confirm:: I connected with patient using audio/video enable telemedicine. I verified patient identity with two identifiers, discussed telehealth limitations, and patient agreed to proceed. Patient Location:: [facility] Provider Location:: Office [place]. Interpreter Needed?: No Pre-visit prep was completed: no AWV questionnaire completed by patient prior to visit?: no Living arrangements:: with family/others Patient's Overall Health Status Rating: good Typical amount of pain: some Does pain affect daily life?: (!) yes Are you currently prescribed opioids?: no  Dietary Habits and Nutritional Risks How many meals a day?: 2 Eats fruit and vegetables daily?: yes Most meals are obtained by: having others provide food In the last 2 weeks, have you had any of the following?: none Diabetic:: (!) yes Any non-healing wounds?: no How often do you check your BS?: 0 Would you like to be referred to a Nutritionist or for Diabetic Management? : no  Functional Status Activities of Daily Living (to include ambulation/medication): Independent Ambulation: Independent Medication Administration: Independent Home Management (perform basic housework or laundry): Independent Manage your own finances?: (Patient-Rptd) yes Primary transportation is: (Patient-Rptd) family / friends Concerns about vision?:  (!) yes (sees eye doctor.) Concerns about hearing?: (!) yes Uses hearing aids?: (!) yes Hear whispered voice?: (!) no *in-person visit only*  Fall Screening Falls in the past year?: 0 Number of falls in past year: 0 Was there an injury with Fall?: 0 Fall Risk Category Calculator: 0 Patient Fall Risk Level: Low Fall Risk  Fall Risk Patient at Risk for Falls Due to: Impaired balance/gait Fall risk Follow up: Falls evaluation completed  Home and Transportation Safety: All rugs have non-skid backing?: yes All stairs or steps have railings?: yes Grab bars in the bathtub or shower?: yes Have non-skid surface in bathtub or shower?: yes Good home lighting?: yes Regular seat belt use?: yes Hospital stays in the last year:: no  Cognitive Assessment Difficulty concentrating, remembering, or making decisions? : no Will 6CIT or Mini Cog be Completed: yes What year is it?: 0 points What month is it?: 0 points Give patient an address phrase to remember (5 components): [address] [place]  About what time is it?: 0 points Count backwards from 20 to 1: 0 points Say the months of the year in reverse: 2 points Repeat the address phrase from earlier: 8 points 6 CIT Score: 10 points  Advance Directives (For Healthcare) Does Patient Have a Medical Advance Directive?: Yes Does patient want to make changes to medical advance directive?: No - Patient declined Type of Advance Directive: Healthcare Power of [place]; Living will; Out of facility DNR (pink MOST or yellow form) Copy of Healthcare Power of Attorney in Chart?: Yes - validated most recent copy scanned in chart (See row information) Copy of Living Will in Chart?: Yes - validated most recent copy scanned in chart (See row information) Out of facility DNR (pink MOST or yellow form) in Chart? (Ambulatory ONLY):  Yes - validated most recent copy scanned in chart  Reviewed/Updated  Reviewed/Updated: Reviewed All (Medical,  Surgical, Family, Medications, Allergies, Care Teams, Patient Goals)    Allergies (verified) Ace inhibitors, Iodinated contrast media, Iodine, and Other   Current Medications (verified) Outpatient Encounter Medications as of 10/06/2024  Medication Sig   albuterol  (VENTOLIN  HFA) 108 (90 Base) MCG/ACT inhaler INHALE TWO PUFFS BY MOUTH DAILY AS NEEDED FOR WHEEZING OR SHORTNESS OF BREATH **EMERGENCY USE**   atorvastatin  (LIPITOR) 10 MG tablet 1 tablet by mouth three times weekly, best taken at bedtime.   Calcium  Carb-Cholecalciferol (CALCIUM /VITAMIN D PO) Take 2 tablets by mouth daily.   donepezil  (ARICEPT ) 10 MG tablet Half tablet at bedtime for 1 month then increase to 1 tablet   ezetimibe  (ZETIA ) 10 MG tablet TAKE 1 TABLET BY MOUTH DAILY   hydrOXYzine  (ATARAX ) 10 MG tablet Take 1 tablet (10 mg total) by mouth daily as needed.   levothyroxine  (SYNTHROID ) 75 MCG tablet Take 1 tablet (75 mcg total) by mouth daily.   meloxicam  (MOBIC ) 15 MG tablet TAKE 1 TABLET BY MOUTH DAILY   Multiple Vitamins-Minerals (PRESERVISION AREDS PO) Take by mouth in the morning and at bedtime.   Nutritional Supplements (FRUIT & VEGETABLE DAILY) CAPS Take by mouth. 2 fruit and 2 Veggie supplements daily (Balance of Nature)   pantoprazole  (PROTONIX ) 40 MG tablet TAKE 1 TABLET BY MOUTH DAILY   STIOLTO RESPIMAT  2.5-2.5 MCG/ACT AERS INHALE 2 INHALATIONS BY MOUTH DAILY   Turmeric (QC TUMERIC COMPLEX PO) Take 1 tablet by mouth in the morning.   venlafaxine  (EFFEXOR ) 75 MG tablet TAKE 1 TABLET BY MOUTH 2 TIMES A DAY   No facility-administered encounter medications on file as of 10/06/2024.    History: Past Medical History:  Diagnosis Date   Anxiety    Asthma    Bell's palsy    Hearing loss bilateral   Hypothyroidism    Left wrist fracture    Macular degeneration, wet (HCC) nxiety   Non Hodgkin's lymphoma (HCC)    Osteoarthritis    Past Surgical History:  Procedure Laterality Date   APPENDECTOMY     CATARACT  EXTRACTION     CHOLECYSTECTOMY     TONSILLECTOMY     TUBAL LIGATION     Family History  Problem Relation Age of Onset   Throat cancer Mother    Heart attack Father    Lung cancer Sister    Dementia Sister    COPD Sister    Obesity Daughter    Pulmonary embolism Daughter    Breast cancer Daughter    Endometriosis Daughter    Hashimoto's thyroiditis Daughter    Social History   Occupational History   Not on file  Tobacco Use   Smoking status: Never   Smokeless tobacco: Never  Vaping Use   Vaping status: Never Used  Substance and Sexual Activity   Alcohol use: Never   Drug use: Never   Sexual activity: Not Currently   Tobacco Counseling Counseling given: Not Answered  SDOH Screenings   Food Insecurity: No Food Insecurity (09/07/2024)  Housing: Low Risk (09/07/2024)  Transportation Needs: No Transportation Needs (09/07/2024)  Depression (PHQ2-9): Low Risk (09/07/2024)  Financial Resource Strain: Low Risk (09/07/2024)  Physical Activity: Insufficiently Active (09/07/2024)  Social Connections: Socially Isolated (09/07/2024)  Stress: No Stress Concern Present (09/07/2024)  Tobacco Use: Low Risk (10/06/2024)   See flowsheets for full screening details  Depression Screen PHQ 2 & 9 Depression Scale- Over the past 2 weeks,  how often have you been bothered by any of the following problems? Little interest or pleasure in doing things: 0 Feeling down, depressed, or hopeless (PHQ Adolescent also includes...irritable): 0 PHQ-2 Total Score: 0 Trouble falling or staying asleep, or sleeping too much: 0 Feeling tired or having little energy: 0 Poor appetite or overeating (PHQ Adolescent also includes...weight loss): 0 Feeling bad about yourself - or that you are a failure or have let yourself or your family down: 0 Trouble concentrating on things, such as reading the newspaper or watching television (PHQ Adolescent also includes...like school work): 0 Moving or speaking so  slowly that other people could have noticed. Or the opposite - being so fidgety or restless that you have been moving around a lot more than usual: 0 Thoughts that you would be better off dead, or of hurting yourself in some way: 0 PHQ-9 Total Score: 0     Goals Addressed   None          Objective:    There were no vitals filed for this visit. There is no height or weight on file to calculate BMI.  Hearing/Vision screen Vision Screening - Comments:: [facility] Last Exam:2025 Immunizations and Health Maintenance Health Maintenance  Topic Date Due   COVID-19 Vaccine (6 - 2025-26 season) 05/09/2024   FOOT EXAM  08/19/2024   Medicare Annual Wellness (AWV)  09/30/2024   OPHTHALMOLOGY EXAM  10/28/2024   HEMOGLOBIN A1C  03/07/2025   DTaP/Tdap/Td (2 - Td or Tdap) 12/27/2026   Pneumococcal Vaccine: 50+ Years  Completed   Influenza Vaccine  Completed   Bone Density Scan  Completed   Zoster Vaccines- Shingrix  Completed   Meningococcal B Vaccine  Aged Out        Assessment/Plan:  This is a routine wellness examination for Barbara Lopez.  Patient Care Team: Caro Harlene POUR, NP as PCP - General (Geriatric Medicine) Leslee Reusing, MD as Consulting Physician (Ophthalmology)  I have personally reviewed and noted the following in the patients chart:   Medical and social history Use of alcohol, tobacco or illicit drugs  Current medications and supplements including opioid prescriptions. Functional ability and status Nutritional status Physical activity Advanced directives List of other physicians Hospitalizations, surgeries, and ER visits in previous 12 months Vitals Screenings to include cognitive, depression, and falls Referrals and appointments  No orders of the defined types were placed in this encounter.  In addition, I have reviewed and discussed with patient certain preventive protocols, quality metrics, and best practice recommendations. A written  personalized care plan for preventive services as well as general preventive health recommendations were provided to patient.   Harlene POUR Caro, NP   10/06/2024   Return in 1 year (on 10/06/2025).  After Visit Summary: (MyChart) Due to this being a telephonic visit, the after visit summary with patients personalized plan was offered to patient via MyChart    "

## 2024-10-06 NOTE — Progress Notes (Signed)
"  °  This service is provided via telemedicine  No vital signs collected/recorded due to the encounter was a telemedicine visit.   Location of patient (ex: home, work):  Home  Patient consents to a telephone visit:  Yes  Location of the provider (ex: office, home):  Office [facility]  Name of any referring provider:  na  Names of all persons participating in the telemedicine service and their role in the encounter:  Barbara Lopez, Patient, [place], Daughter, Donzell Fenix Rorke, CMA, Harlene An, NP  Time spent on call:  7:11  "

## 2025-01-24 ENCOUNTER — Ambulatory Visit: Admitting: Dermatology

## 2025-03-13 ENCOUNTER — Ambulatory Visit: Admitting: Nurse Practitioner

## 2025-10-13 ENCOUNTER — Ambulatory Visit: Admitting: Nurse Practitioner
